# Patient Record
Sex: Male | Born: 1940 | Race: White | Hispanic: No | Marital: Married | Smoking: Former smoker
Health system: Southern US, Community
[De-identification: ages and names within clinical notes are randomized; demographics above are authoritative.]

## PROBLEM LIST (undated history)

## (undated) DIAGNOSIS — C20 Malignant neoplasm of rectum: Secondary | ICD-10-CM

## (undated) DIAGNOSIS — C61 Malignant neoplasm of prostate: Secondary | ICD-10-CM

## (undated) DIAGNOSIS — C19 Malignant neoplasm of rectosigmoid junction: Secondary | ICD-10-CM

## (undated) DIAGNOSIS — K219 Gastro-esophageal reflux disease without esophagitis: Secondary | ICD-10-CM

## (undated) DIAGNOSIS — C801 Malignant (primary) neoplasm, unspecified: Secondary | ICD-10-CM

## (undated) HISTORY — DX: Malignant (primary) neoplasm, unspecified: C80.1

## (undated) HISTORY — DX: Malignant neoplasm of rectosigmoid junction: C19

## (undated) HISTORY — DX: Malignant neoplasm of prostate: C61

## (undated) HISTORY — DX: Malignant neoplasm of rectum: C20

## (undated) HISTORY — DX: Gastro-esophageal reflux disease without esophagitis: K21.9

---

## 2000-07-09 HISTORY — PX: PROSTATECTOMY: SHX69

## 2000-09-03 ENCOUNTER — Emergency Department (HOSPITAL_COMMUNITY): Admission: EM | Admit: 2000-09-03 | Discharge: 2000-09-03 | Payer: Self-pay | Admitting: Emergency Medicine

## 2000-09-25 ENCOUNTER — Emergency Department (HOSPITAL_COMMUNITY): Admission: EM | Admit: 2000-09-25 | Discharge: 2000-09-25 | Payer: Self-pay | Admitting: Emergency Medicine

## 2003-07-10 HISTORY — PX: RECTAL SURGERY: SHX760

## 2003-11-02 ENCOUNTER — Inpatient Hospital Stay (HOSPITAL_COMMUNITY): Admission: EM | Admit: 2003-11-02 | Discharge: 2003-11-05 | Payer: Self-pay | Admitting: Emergency Medicine

## 2003-11-03 ENCOUNTER — Encounter (INDEPENDENT_AMBULATORY_CARE_PROVIDER_SITE_OTHER): Payer: Self-pay | Admitting: *Deleted

## 2003-11-05 ENCOUNTER — Encounter: Payer: Self-pay | Admitting: Oncology

## 2003-11-09 ENCOUNTER — Ambulatory Visit: Admission: RE | Admit: 2003-11-09 | Discharge: 2003-12-24 | Payer: Self-pay | Admitting: *Deleted

## 2003-12-28 ENCOUNTER — Ambulatory Visit (HOSPITAL_COMMUNITY): Admission: RE | Admit: 2003-12-28 | Discharge: 2003-12-28 | Payer: Self-pay | Admitting: Otolaryngology

## 2004-01-07 HISTORY — PX: RADICAL NECK DISSECTION: SHX2284

## 2004-01-17 ENCOUNTER — Ambulatory Visit (HOSPITAL_COMMUNITY): Admission: RE | Admit: 2004-01-17 | Discharge: 2004-01-17 | Payer: Self-pay | Admitting: Otolaryngology

## 2004-01-17 ENCOUNTER — Encounter (INDEPENDENT_AMBULATORY_CARE_PROVIDER_SITE_OTHER): Payer: Self-pay | Admitting: *Deleted

## 2004-01-21 ENCOUNTER — Ambulatory Visit: Admission: RE | Admit: 2004-01-21 | Discharge: 2004-01-21 | Payer: Self-pay | Admitting: *Deleted

## 2004-01-25 ENCOUNTER — Ambulatory Visit (HOSPITAL_COMMUNITY): Admission: RE | Admit: 2004-01-25 | Discharge: 2004-01-25 | Payer: Self-pay | Admitting: Oncology

## 2004-01-31 ENCOUNTER — Ambulatory Visit (HOSPITAL_COMMUNITY): Admission: RE | Admit: 2004-01-31 | Discharge: 2004-01-31 | Payer: Self-pay | Admitting: Otolaryngology

## 2004-01-31 ENCOUNTER — Encounter (INDEPENDENT_AMBULATORY_CARE_PROVIDER_SITE_OTHER): Payer: Self-pay | Admitting: *Deleted

## 2004-02-21 ENCOUNTER — Inpatient Hospital Stay (HOSPITAL_COMMUNITY): Admission: RE | Admit: 2004-02-21 | Discharge: 2004-02-26 | Payer: Self-pay | Admitting: Surgery

## 2004-02-21 ENCOUNTER — Encounter (INDEPENDENT_AMBULATORY_CARE_PROVIDER_SITE_OTHER): Payer: Self-pay | Admitting: Specialist

## 2004-03-07 ENCOUNTER — Ambulatory Visit: Admission: RE | Admit: 2004-03-07 | Discharge: 2004-03-22 | Payer: Self-pay | Admitting: *Deleted

## 2004-03-31 ENCOUNTER — Ambulatory Visit: Admission: RE | Admit: 2004-03-31 | Discharge: 2004-03-31 | Payer: Self-pay | Admitting: *Deleted

## 2004-04-21 ENCOUNTER — Inpatient Hospital Stay (HOSPITAL_COMMUNITY): Admission: RE | Admit: 2004-04-21 | Discharge: 2004-04-23 | Payer: Self-pay | Admitting: Otolaryngology

## 2004-04-21 ENCOUNTER — Encounter (INDEPENDENT_AMBULATORY_CARE_PROVIDER_SITE_OTHER): Payer: Self-pay | Admitting: *Deleted

## 2004-05-08 ENCOUNTER — Ambulatory Visit: Admission: RE | Admit: 2004-05-08 | Discharge: 2004-08-06 | Payer: Self-pay | Admitting: *Deleted

## 2004-05-17 ENCOUNTER — Ambulatory Visit (HOSPITAL_COMMUNITY): Admission: RE | Admit: 2004-05-17 | Discharge: 2004-05-17 | Payer: Self-pay | Admitting: *Deleted

## 2004-05-24 ENCOUNTER — Ambulatory Visit: Payer: Self-pay | Admitting: Oncology

## 2004-07-02 ENCOUNTER — Emergency Department (HOSPITAL_COMMUNITY): Admission: EM | Admit: 2004-07-02 | Discharge: 2004-07-02 | Payer: Self-pay

## 2004-07-05 ENCOUNTER — Encounter (HOSPITAL_COMMUNITY): Admission: RE | Admit: 2004-07-05 | Discharge: 2004-07-08 | Payer: Self-pay | Admitting: Oncology

## 2004-07-11 ENCOUNTER — Inpatient Hospital Stay (HOSPITAL_COMMUNITY): Admission: EM | Admit: 2004-07-11 | Discharge: 2004-07-13 | Payer: Self-pay | Admitting: Oncology

## 2004-07-11 ENCOUNTER — Ambulatory Visit: Payer: Self-pay | Admitting: Oncology

## 2004-07-15 ENCOUNTER — Ambulatory Visit (HOSPITAL_COMMUNITY): Admission: AD | Admit: 2004-07-15 | Discharge: 2004-07-15 | Payer: Self-pay | Admitting: Oncology

## 2004-07-16 ENCOUNTER — Ambulatory Visit (HOSPITAL_COMMUNITY): Admission: AD | Admit: 2004-07-16 | Discharge: 2004-07-16 | Payer: Self-pay | Admitting: Oncology

## 2004-08-30 ENCOUNTER — Ambulatory Visit: Admission: RE | Admit: 2004-08-30 | Discharge: 2004-08-30 | Payer: Self-pay | Admitting: *Deleted

## 2004-09-15 ENCOUNTER — Ambulatory Visit: Payer: Self-pay | Admitting: Oncology

## 2004-11-06 ENCOUNTER — Ambulatory Visit: Payer: Self-pay | Admitting: Oncology

## 2004-11-09 ENCOUNTER — Ambulatory Visit (HOSPITAL_COMMUNITY): Admission: RE | Admit: 2004-11-09 | Discharge: 2004-11-09 | Payer: Self-pay | Admitting: Oncology

## 2004-11-14 ENCOUNTER — Encounter: Admission: RE | Admit: 2004-11-14 | Discharge: 2005-02-12 | Payer: Self-pay | Admitting: Oncology

## 2004-11-27 ENCOUNTER — Ambulatory Visit (HOSPITAL_BASED_OUTPATIENT_CLINIC_OR_DEPARTMENT_OTHER): Admission: RE | Admit: 2004-11-27 | Discharge: 2004-11-27 | Payer: Self-pay | Admitting: Surgery

## 2004-11-27 ENCOUNTER — Ambulatory Visit (HOSPITAL_COMMUNITY): Admission: RE | Admit: 2004-11-27 | Discharge: 2004-11-27 | Payer: Self-pay | Admitting: Surgery

## 2004-11-29 ENCOUNTER — Ambulatory Visit: Admission: RE | Admit: 2004-11-29 | Discharge: 2004-11-29 | Payer: Self-pay | Admitting: *Deleted

## 2005-02-02 ENCOUNTER — Ambulatory Visit: Payer: Self-pay | Admitting: Oncology

## 2005-05-03 ENCOUNTER — Ambulatory Visit (HOSPITAL_COMMUNITY): Admission: RE | Admit: 2005-05-03 | Discharge: 2005-05-03 | Payer: Self-pay | Admitting: Oncology

## 2005-05-03 ENCOUNTER — Ambulatory Visit: Payer: Self-pay | Admitting: Oncology

## 2005-07-23 ENCOUNTER — Ambulatory Visit: Payer: Self-pay | Admitting: Oncology

## 2005-07-26 ENCOUNTER — Ambulatory Visit (HOSPITAL_COMMUNITY): Admission: RE | Admit: 2005-07-26 | Discharge: 2005-07-26 | Payer: Self-pay | Admitting: Oncology

## 2005-08-03 ENCOUNTER — Ambulatory Visit: Admission: RE | Admit: 2005-08-03 | Discharge: 2005-08-10 | Payer: Self-pay | Admitting: *Deleted

## 2006-01-17 ENCOUNTER — Ambulatory Visit: Payer: Self-pay | Admitting: Oncology

## 2006-01-18 LAB — CBC WITH DIFFERENTIAL (CANCER CENTER ONLY)
EOS%: 2 % (ref 0.0–7.0)
Eosinophils Absolute: 0.1 10*3/uL (ref 0.0–0.5)
LYMPH%: 20.5 % (ref 14.0–48.0)
MCH: 28.9 pg (ref 28.0–33.4)
MCHC: 32.9 g/dL (ref 32.0–35.9)
MCV: 88 fL (ref 82–98)
MONO%: 6.2 % (ref 0.0–13.0)
NEUT#: 2.9 10*3/uL (ref 1.5–6.5)
Platelets: 160 10*3/uL (ref 145–400)
RBC: 4.82 10*6/uL (ref 4.20–5.70)
RDW: 11.8 % (ref 10.5–14.6)

## 2006-01-19 LAB — COMPREHENSIVE METABOLIC PANEL
ALT: 15 U/L (ref 0–40)
Alkaline Phosphatase: 55 U/L (ref 39–117)
Sodium: 140 mEq/L (ref 135–145)
Total Bilirubin: 0.5 mg/dL (ref 0.3–1.2)
Total Protein: 7.7 g/dL (ref 6.0–8.3)

## 2006-01-19 LAB — CEA: CEA: 1.9 ng/mL (ref 0.0–5.0)

## 2006-01-25 ENCOUNTER — Ambulatory Visit (HOSPITAL_COMMUNITY): Admission: RE | Admit: 2006-01-25 | Discharge: 2006-01-25 | Payer: Self-pay | Admitting: Oncology

## 2006-02-08 ENCOUNTER — Ambulatory Visit: Admission: RE | Admit: 2006-02-08 | Discharge: 2006-05-09 | Payer: Self-pay | Admitting: *Deleted

## 2006-07-16 ENCOUNTER — Ambulatory Visit: Payer: Self-pay | Admitting: Oncology

## 2006-07-19 LAB — CBC WITH DIFFERENTIAL (CANCER CENTER ONLY)
BASO%: 0.3 % (ref 0.0–2.0)
Eosinophils Absolute: 0.1 10*3/uL (ref 0.0–0.5)
LYMPH%: 21 % (ref 14.0–48.0)
MCV: 89 fL (ref 82–98)
MONO#: 0.1 10*3/uL (ref 0.1–0.9)
MONO%: 3.5 % (ref 0.0–13.0)
NEUT#: 3 10*3/uL (ref 1.5–6.5)
Platelets: 144 10*3/uL — ABNORMAL LOW (ref 145–400)
RBC: 4.86 10*6/uL (ref 4.20–5.70)
RDW: 11.3 % (ref 10.5–14.6)
WBC: 4.1 10*3/uL (ref 4.0–10.0)

## 2006-07-19 LAB — COMPREHENSIVE METABOLIC PANEL
AST: 14 U/L (ref 0–37)
Albumin: 4.7 g/dL (ref 3.5–5.2)
BUN: 14 mg/dL (ref 6–23)
Calcium: 9.5 mg/dL (ref 8.4–10.5)
Chloride: 104 mEq/L (ref 96–112)
Glucose, Bld: 118 mg/dL — ABNORMAL HIGH (ref 70–99)
Potassium: 4.3 mEq/L (ref 3.5–5.3)
Total Protein: 7.6 g/dL (ref 6.0–8.3)

## 2006-07-19 LAB — CEA: CEA: 1.4 ng/mL (ref 0.0–5.0)

## 2006-07-26 ENCOUNTER — Ambulatory Visit (HOSPITAL_COMMUNITY): Admission: RE | Admit: 2006-07-26 | Discharge: 2006-07-26 | Payer: Self-pay | Admitting: Oncology

## 2007-01-16 ENCOUNTER — Ambulatory Visit: Payer: Self-pay | Admitting: Oncology

## 2007-01-17 LAB — COMPREHENSIVE METABOLIC PANEL
ALT: 14 U/L (ref 0–53)
BUN: 15 mg/dL (ref 6–23)
CO2: 25 mEq/L (ref 19–32)
Creatinine, Ser: 1.08 mg/dL (ref 0.40–1.50)
Total Bilirubin: 0.4 mg/dL (ref 0.3–1.2)

## 2007-01-17 LAB — CBC WITH DIFFERENTIAL (CANCER CENTER ONLY)
BASO%: 0.5 % (ref 0.0–2.0)
Eosinophils Absolute: 0.1 10*3/uL (ref 0.0–0.5)
HCT: 42.2 % (ref 38.7–49.9)
LYMPH%: 21.4 % (ref 14.0–48.0)
MCV: 88 fL (ref 82–98)
MONO#: 0.4 10*3/uL (ref 0.1–0.9)
NEUT%: 69.6 % (ref 40.0–80.0)
RDW: 11.1 % (ref 10.5–14.6)
WBC: 5.4 10*3/uL (ref 4.0–10.0)

## 2007-01-17 LAB — CEA: CEA: 1.4 ng/mL (ref 0.0–5.0)

## 2007-01-17 IMAGING — CT CT PELVIS W/ CM
1 of 5 series · 10 of 32 positions shown, 16 images · IV contrast (omnipaque)
Comparison: 01/25/04 CT from [HOSPITAL].
COMPARISON: 01/25/04 CT [HOSPITAL].
COMPARISON: 01/25/04 CT [HOSPITAL].

CLINICAL DATA: History of head and neck squamous cell carcinoma.  Had enlarged lymph nodes removed via modified radical neck dissection.  Rectal cancer.
CT CHEST SCAN WITH IV CONTRAST:
TECHNIQUE: Transaxial cuts were acquired during IV infusion of 125 cc Omnipaque 300.
TECHNIQUE: Transaxial cuts were acquired following oral Gastrografin and IV infusion of 125 cc of Omnipaque 300.
TECHNIQUE: Trans axial cuts were acquired from the superior mediastinum to the level of the third ventricle intracranially.  No enlarged superior mediastinal or supraclavicular nodes.  MRI neck on 12/28/03 revealed adenopathy left submandibular level.  Patient has had left neck dissection and chemotherapy and radiation therapy.  Postoperative soft tissue changes are appreciated in the left submandibular region with some slight enlargement and ill definition of margins of the left sternocleidomastoid muscle and mild edema preauricular and periparotid regions plus some thickening and ill definition of a platysma muscle.  There is a slightly prominent sized node just left anterolateral to the hyoid bone which is not pathologically enlarged.  No pathologically enlarged masses are appreciated.  
Posttherapy changes of the neck.  No findings to strongly suggest recurrent tumor.

[Series 2: cap 5.0 b40f st · axial · 0.71mm/px · z∈[-758,-228]mm · 10 of 128 slices shown, 16 images]
[im 11/128  soft-tissue]
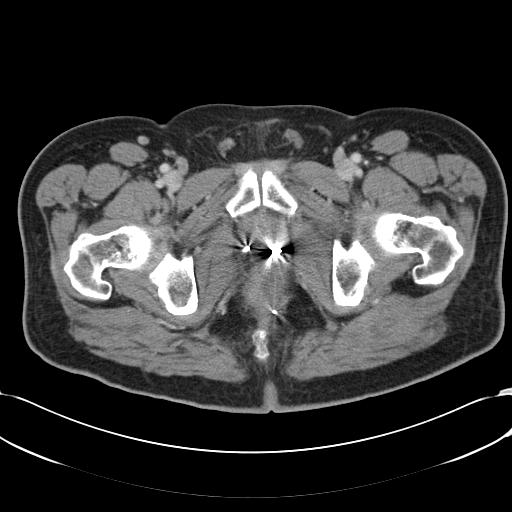
[im 11/128  bone]
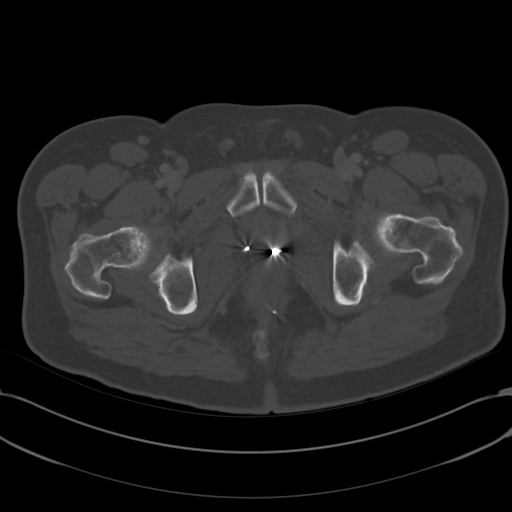
[im 22/128  soft-tissue]
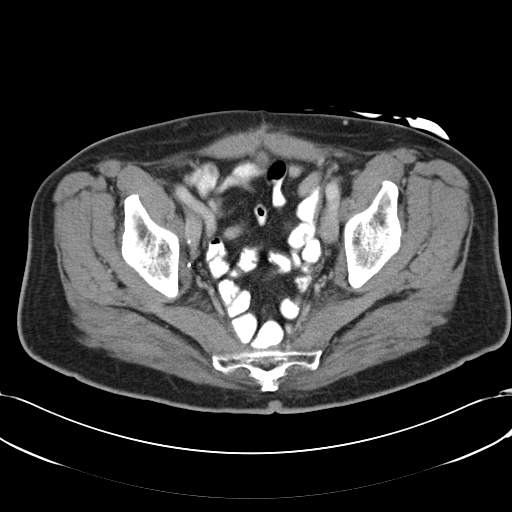
[im 32/128  soft-tissue]
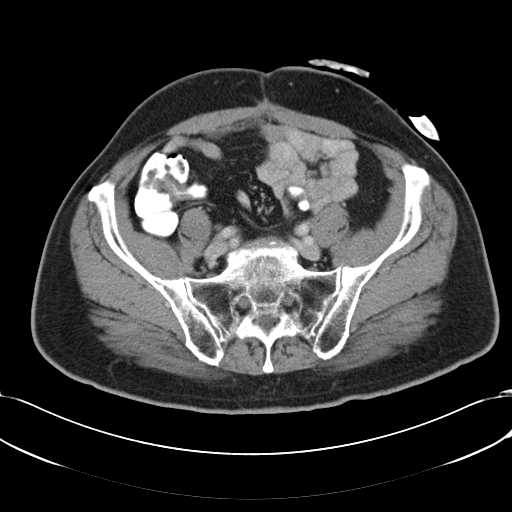
[im 43/128  soft-tissue]
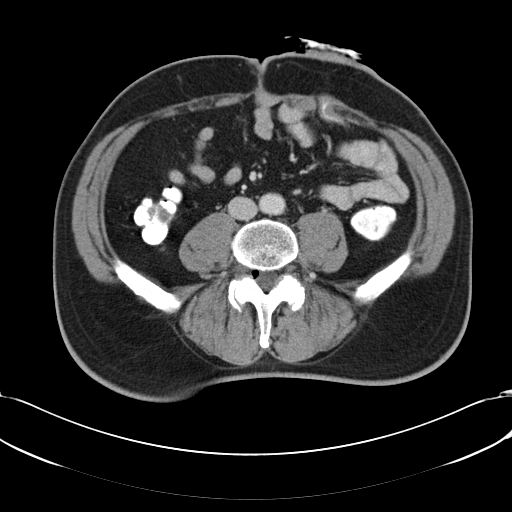
[im 53/128  soft-tissue]
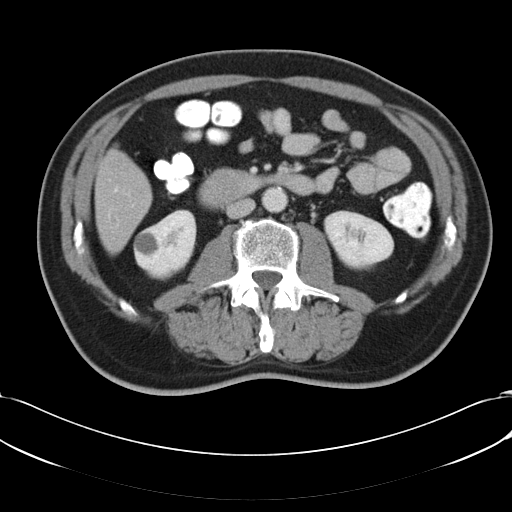
[im 75/128  soft-tissue]
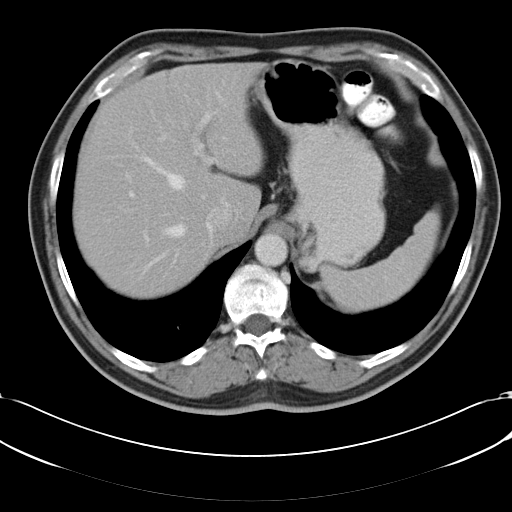
[im 85/128  soft-tissue]
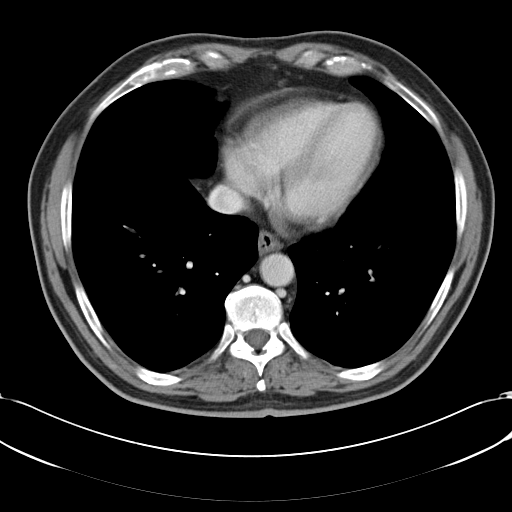
[im 85/128  lung]
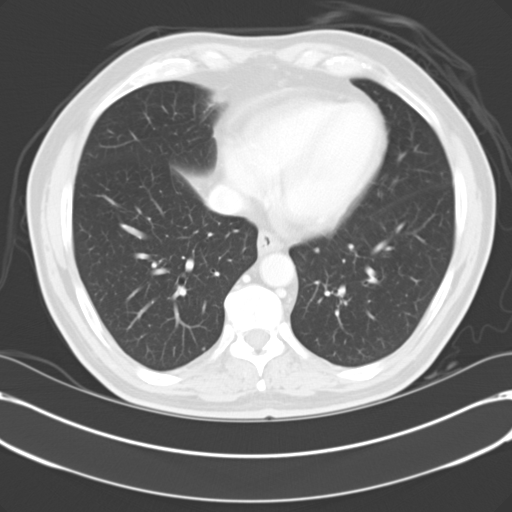
[im 96/128  soft-tissue]
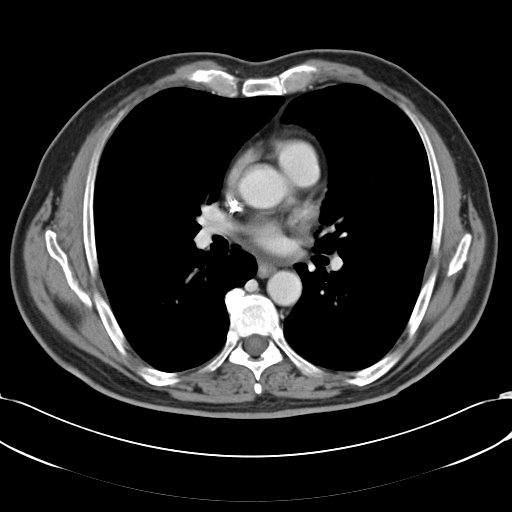
[im 96/128  lung]
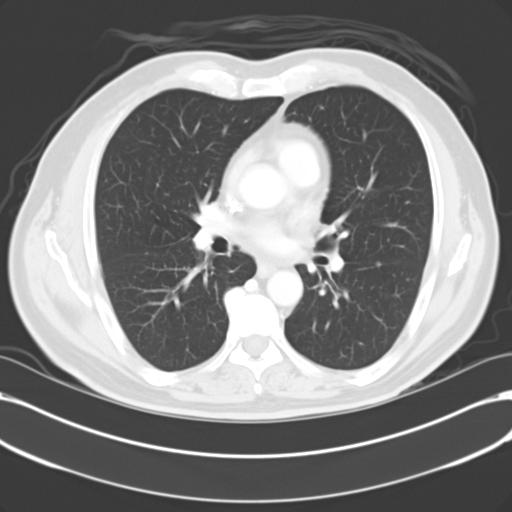
[im 106/128  soft-tissue]
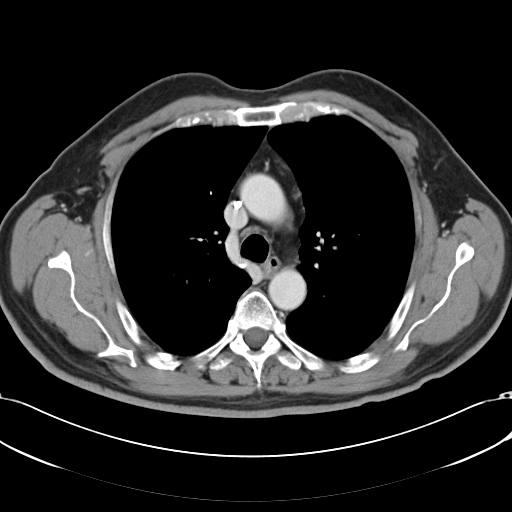
[im 106/128  lung]
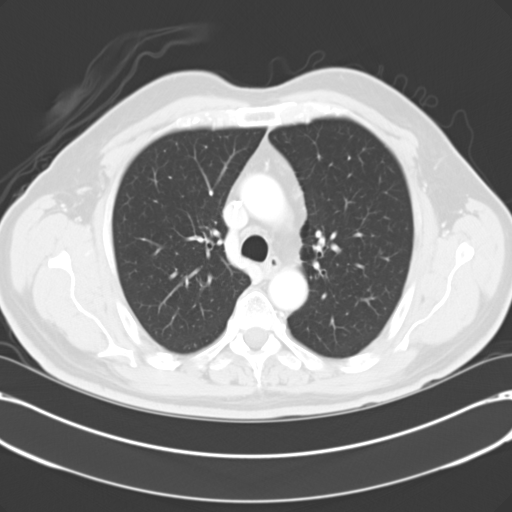
[im 106/128  bone]
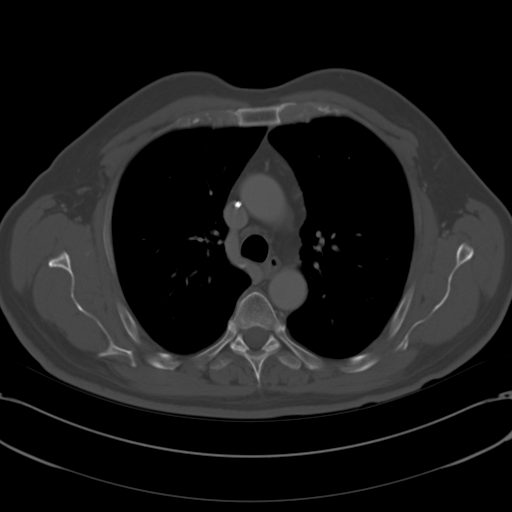
[im 117/128  soft-tissue]
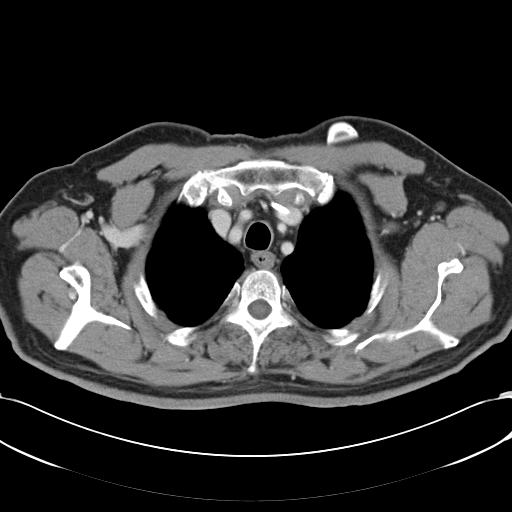
[im 117/128  lung]
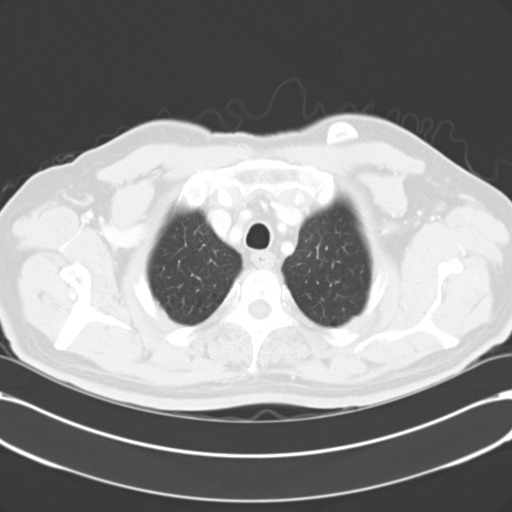

[10 of 32 positions shown; findings below may reference images not displayed]

FINDINGS: No enlarged lymph nodes.  Minute nodule right posterior lung base measuring approximately 5 mm (see image number 59) in diameter and also minute subpleural nodule posterolateral aspect of right lower lobe (see image number 39) measuring approximately 4 mm in diameter.  These two small right lung nodules were noted on the prior exam as well and are stable.  CVC via left subclavian vein approach with the tip in the SVC.
IMPRESSION: Stable chest CT scan.  No definite findings to suggest metastatic involvement.  
ABDOMINAL CT SCAN WITH IV CONTRAST:
FINDINGS: Hiatal hernia.  Negative for neoplastic involvement of the liver, spleen, or adrenal glands.  Small bilateral renal cysts the largest noted in the lateral aspect of the lower pole of the right kidney measuring 1.6 cm in diameter.  No pathologically enlarged abdominal nodes.  Ostomy site left anterior abdominal wall appears unremarkable.  Again appreciated and slightly larger is a small collection of gas in the lateral aspect of the central canal on the right at L4-5 level apparently representing nitrogen gas which probably originated from a degenerated disc or potentially a gas filled synovial cyst from degenerated facet joint.  Findings likely not clinically significant in this clinical setting.  Multiple small gallstones again noted.
IMPRESSION: Negative for neoplastic involvement of the abdomen.  See comments above.  
PELVIC CT SCAN WITH IV CONTRAST:
FINDINGS: Numerous surgical clips bilaterally low true pelvis projecting basically between the bladder and obturator internus muscles and also near the prostate and presacral region.  Presacral soft tissue thickening increased in degree since prior exam.  Additional metallic surgical clips in the presacral region since the prior exam as well.  Also now evidence of increased area of wall thickening of the bladder.  Previously noted 7 X 13 mm oval soft tissue structure just to the right of the rectum apparently representing a perirectal node is no longer appreciated.  The bladder and small bowel loops fall posteriorly and inferiorly towards the lower presacral region.  Findings are compatible with an AP resection and likely radiation therapy changes.  Subtle small bowel wall thickening may involve a couple of loops in the lower true pelvis possibly related to therapy however this latter finding is somewhat equivocal.   Incidentally appreciated is a chronic lesion involving the medial intertrochanteric region of the right femur possibly a chondroid lesion which has been noted on prior exams for example 10/26/03.
IMPRESSION: Findings compatible with posttherapy changes of the pelvis as described above.  No definite findings to strongly suggest recurrent or residual tumor.  
CT SCAN OF THE NECK WITH IV CONTRAST:
IMPRESSION: No definite evidence of recurrent neck tumor.  

Dr. Benoit review CT neck case regarding findings and comparison with PET scan and prior data.

## 2007-01-24 ENCOUNTER — Ambulatory Visit (HOSPITAL_COMMUNITY): Admission: RE | Admit: 2007-01-24 | Discharge: 2007-01-24 | Payer: Self-pay | Admitting: Oncology

## 2007-07-23 ENCOUNTER — Ambulatory Visit: Payer: Self-pay | Admitting: Oncology

## 2007-07-25 LAB — CBC WITH DIFFERENTIAL (CANCER CENTER ONLY)
BASO%: 0.4 % (ref 0.0–2.0)
EOS%: 2 % (ref 0.0–7.0)
HCT: 41.6 % (ref 38.7–49.9)
LYMPH%: 22.1 % (ref 14.0–48.0)
MCHC: 33.5 g/dL (ref 32.0–35.9)
MCV: 87 fL (ref 82–98)
MONO%: 4.2 % (ref 0.0–13.0)
NEUT%: 71.3 % (ref 40.0–80.0)
RDW: 12.1 % (ref 10.5–14.6)

## 2007-07-25 LAB — COMPREHENSIVE METABOLIC PANEL
AST: 21 U/L (ref 0–37)
Alkaline Phosphatase: 54 U/L (ref 39–117)
BUN: 16 mg/dL (ref 6–23)
Creatinine, Ser: 1.11 mg/dL (ref 0.40–1.50)
Potassium: 3.9 mEq/L (ref 3.5–5.3)
Total Bilirubin: 0.5 mg/dL (ref 0.3–1.2)

## 2007-07-25 LAB — PSA: PSA: 0.01 ng/mL — ABNORMAL LOW (ref 0.10–4.00)

## 2007-07-28 ENCOUNTER — Ambulatory Visit (HOSPITAL_COMMUNITY): Admission: RE | Admit: 2007-07-28 | Discharge: 2007-07-28 | Payer: Self-pay | Admitting: Oncology

## 2008-01-15 ENCOUNTER — Ambulatory Visit: Payer: Self-pay | Admitting: Oncology

## 2008-01-19 LAB — CBC WITH DIFFERENTIAL (CANCER CENTER ONLY)
BASO#: 0 10*3/uL (ref 0.0–0.2)
EOS%: 1.6 % (ref 0.0–7.0)
Eosinophils Absolute: 0.1 10*3/uL (ref 0.0–0.5)
HGB: 13.6 g/dL (ref 13.0–17.1)
LYMPH%: 24.4 % (ref 14.0–48.0)
MCH: 29.2 pg (ref 28.0–33.4)
MCHC: 34.3 g/dL (ref 32.0–35.9)
MCV: 85 fL (ref 82–98)
MONO%: 7.7 % (ref 0.0–13.0)
NEUT#: 2.4 10*3/uL (ref 1.5–6.5)
NEUT%: 65.8 % (ref 40.0–80.0)
RBC: 4.65 10*6/uL (ref 4.20–5.70)

## 2008-01-19 LAB — LACTATE DEHYDROGENASE: LDH: 156 U/L (ref 94–250)

## 2008-01-19 LAB — COMPREHENSIVE METABOLIC PANEL
BUN: 16 mg/dL (ref 6–23)
CO2: 23 mEq/L (ref 19–32)
Calcium: 9.2 mg/dL (ref 8.4–10.5)
Chloride: 105 mEq/L (ref 96–112)
Creatinine, Ser: 1.07 mg/dL (ref 0.40–1.50)

## 2008-01-19 LAB — PSA: PSA: 0.01 ng/mL — ABNORMAL LOW (ref 0.10–4.00)

## 2008-01-23 ENCOUNTER — Ambulatory Visit (HOSPITAL_COMMUNITY): Admission: RE | Admit: 2008-01-23 | Discharge: 2008-01-23 | Payer: Self-pay | Admitting: Oncology

## 2008-07-15 ENCOUNTER — Ambulatory Visit: Payer: Self-pay | Admitting: Oncology

## 2008-07-19 LAB — CMP (CANCER CENTER ONLY)
AST: 25 U/L (ref 11–38)
Albumin: 4 g/dL (ref 3.3–5.5)
Alkaline Phosphatase: 47 U/L (ref 26–84)
Glucose, Bld: 105 mg/dL (ref 73–118)
Potassium: 4.6 mEq/L (ref 3.3–4.7)
Sodium: 138 mEq/L (ref 128–145)
Total Protein: 7.8 g/dL (ref 6.4–8.1)

## 2008-07-19 LAB — CBC WITH DIFFERENTIAL (CANCER CENTER ONLY)
BASO#: 0 10*3/uL (ref 0.0–0.2)
EOS%: 1.6 % (ref 0.0–7.0)
Eosinophils Absolute: 0.1 10*3/uL (ref 0.0–0.5)
HCT: 41.6 % (ref 38.7–49.9)
HGB: 13.9 g/dL (ref 13.0–17.1)
LYMPH#: 1 10*3/uL (ref 0.9–3.3)
MCH: 29.4 pg (ref 28.0–33.4)
MCHC: 33.5 g/dL (ref 32.0–35.9)
NEUT%: 70.8 % (ref 40.0–80.0)
RBC: 4.74 10*6/uL (ref 4.20–5.70)

## 2008-07-19 LAB — PSA: PSA: <0.01 ng/mL — ABNORMAL LOW (ref 0.10–4.00)

## 2009-01-12 ENCOUNTER — Ambulatory Visit: Payer: Self-pay | Admitting: Oncology

## 2009-01-14 LAB — CBC WITH DIFFERENTIAL (CANCER CENTER ONLY)
BASO#: 0 10*3/uL (ref 0.0–0.2)
BASO%: 0.4 % (ref 0.0–2.0)
HCT: 38.5 % — ABNORMAL LOW (ref 38.7–49.9)
HGB: 13.4 g/dL (ref 13.0–17.1)
LYMPH#: 1 10*3/uL (ref 0.9–3.3)
MONO#: 0.2 10*3/uL (ref 0.1–0.9)
NEUT#: 2.6 10*3/uL (ref 1.5–6.5)
NEUT%: 66.9 % (ref 40.0–80.0)
WBC: 3.9 10*3/uL — ABNORMAL LOW (ref 4.0–10.0)

## 2009-01-14 LAB — CMP (CANCER CENTER ONLY)
AST: 21 U/L (ref 11–38)
BUN, Bld: 15 mg/dL (ref 7–22)
Calcium: 9.5 mg/dL (ref 8.0–10.3)
Chloride: 99 mEq/L (ref 98–108)
Creat: 1 mg/dl (ref 0.6–1.2)

## 2009-07-27 ENCOUNTER — Ambulatory Visit: Payer: Self-pay | Admitting: Oncology

## 2009-07-29 LAB — CMP (CANCER CENTER ONLY)
ALT(SGPT): 23 U/L (ref 10–47)
AST: 26 U/L (ref 11–38)
Albumin: 3.9 g/dL (ref 3.3–5.5)
BUN, Bld: 15 mg/dL (ref 7–22)
CO2: 27 mEq/L (ref 18–33)
Potassium: 4 mEq/L (ref 3.3–4.7)
Sodium: 137 mEq/L (ref 128–145)
Total Bilirubin: 0.5 mg/dl (ref 0.20–1.60)

## 2009-07-29 LAB — CBC WITH DIFFERENTIAL (CANCER CENTER ONLY)
HGB: 13.9 g/dL (ref 13.0–17.1)
LYMPH%: 26.6 % (ref 14.0–48.0)
MCH: 30.1 pg (ref 28.0–33.4)
MCHC: 33.4 g/dL (ref 32.0–35.9)
MONO%: 3.9 % (ref 0.0–13.0)
NEUT#: 3.1 10*3/uL (ref 1.5–6.5)
NEUT%: 67.2 % (ref 40.0–80.0)
RDW: 12 % (ref 10.5–14.6)

## 2009-07-29 LAB — CEA: CEA: 0.9 ng/mL (ref 0.0–5.0)

## 2010-01-31 ENCOUNTER — Ambulatory Visit: Payer: Self-pay | Admitting: Oncology

## 2010-02-02 LAB — CBC WITH DIFFERENTIAL/PLATELET
BASO%: 0.5 % (ref 0.0–2.0)
Basophils Absolute: 0 10*3/uL (ref 0.0–0.1)
EOS%: 1.3 % (ref 0.0–7.0)
HGB: 13.6 g/dL (ref 13.0–17.1)
LYMPH%: 25.9 % (ref 14.0–49.0)
MCH: 31 pg (ref 27.2–33.4)
MCHC: 34.3 g/dL (ref 32.0–36.0)
MCV: 90.3 fL (ref 79.3–98.0)
MONO#: 0.2 10*3/uL (ref 0.1–0.9)
MONO%: 5.6 % (ref 0.0–14.0)
NEUT#: 2.9 10*3/uL (ref 1.5–6.5)
NEUT%: 66.7 % (ref 39.0–75.0)
Platelets: 151 10*3/uL (ref 140–400)
lymph#: 1.1 10*3/uL (ref 0.9–3.3)

## 2010-02-02 LAB — CMP (CANCER CENTER ONLY)
ALT(SGPT): 22 U/L (ref 10–47)
Creat: 1.1 mg/dl (ref 0.6–1.2)
Potassium: 4.3 mEq/L (ref 3.3–4.7)
Sodium: 135 mEq/L (ref 128–145)
Total Protein: 7.5 g/dL (ref 6.4–8.1)

## 2010-07-30 ENCOUNTER — Encounter: Payer: Self-pay | Admitting: Oncology

## 2010-07-31 ENCOUNTER — Ambulatory Visit: Payer: Self-pay | Admitting: Oncology

## 2010-08-02 LAB — CBC WITH DIFFERENTIAL/PLATELET
HCT: 39.9 % (ref 38.4–49.9)
HGB: 13.5 g/dL (ref 13.0–17.1)
MCH: 30.3 pg (ref 27.2–33.4)
MCHC: 33.8 g/dL (ref 32.0–36.0)
MCV: 89.7 fL (ref 79.3–98.0)
MONO#: 0.2 10*3/uL (ref 0.1–0.9)
NEUT%: 68 % (ref 39.0–75.0)
RBC: 4.45 10*6/uL (ref 4.20–5.82)
lymph#: 1.1 10*3/uL (ref 0.9–3.3)

## 2010-08-02 LAB — CEA: CEA: 0.6 ng/mL (ref 0.0–5.0)

## 2010-08-02 LAB — COMPREHENSIVE METABOLIC PANEL
Albumin: 4.5 g/dL (ref 3.5–5.2)
BUN: 12 mg/dL (ref 6–23)
Calcium: 9.4 mg/dL (ref 8.4–10.5)
Chloride: 103 mEq/L (ref 96–112)
Potassium: 4.3 mEq/L (ref 3.5–5.3)
Total Protein: 7 g/dL (ref 6.0–8.3)

## 2010-10-11 ENCOUNTER — Emergency Department (HOSPITAL_COMMUNITY): Payer: Medicare Other

## 2010-10-11 ENCOUNTER — Emergency Department (HOSPITAL_COMMUNITY)
Admission: EM | Admit: 2010-10-11 | Discharge: 2010-10-11 | Disposition: A | Discharge status: Home / Self Care | Payer: Medicare Other | Attending: Emergency Medicine | Admitting: Emergency Medicine

## 2010-10-11 DIAGNOSIS — Z85048 Personal history of other malignant neoplasm of rectum, rectosigmoid junction, and anus: Secondary | ICD-10-CM | POA: Insufficient documentation

## 2010-10-11 DIAGNOSIS — R1909 Other intra-abdominal and pelvic swelling, mass and lump: Secondary | ICD-10-CM | POA: Insufficient documentation

## 2010-10-11 LAB — URINALYSIS, ROUTINE W REFLEX MICROSCOPIC
Bilirubin Urine: NEGATIVE
Nitrite: NEGATIVE
Specific Gravity, Urine: 1.021 (ref 1.005–1.030)
pH: 5.5 (ref 5.0–8.0)

## 2010-11-24 NOTE — Op Note (Signed)
NAMEROYDEN, BULMAN               ACCOUNT NO.:  [id number]   MEDICAL RECORD NO.:  [id number]          PATIENT TYPE:  AMB   LOCATION:  [facility]                         FACILITY:  [facility]   PHYSICIAN:  Thornton Park. Daphine Deutscher, MD  DATE OF BIRTH:  04/26/1941   DATE OF PROCEDURE:  11/27/2004  DATE OF DISCHARGE:                                 OPERATIVE REPORT   PROCEDURE:  Removal of left subclavian Port-A-Cath.   SURGEON:  Dr. Daphine Deutscher   ANESTHESIA:  MAC local   DESCRIPTION OF PROCEDURE:  Jamonte Curfman was brought to OR 4 at [facility] and given some sedation.  The left chest was prepped with Betadine  and draped sterilely.  I infiltrated the previous scar with  Marcaine/lidocaine and made an incision down to the port.  I removed the two  Prolene sutures that anchored it in place and then was able to explant this  without difficulty.  The wound was then closed with a running 4-0 Vicryl  with Benzoin and Steri-Strips on the skin.  The patient seemed to tolerate  the procedure well and was released in the care of his wife and will be  followed up on an as needed basis.      MBM/MEDQ  D:  11/27/2004  T:  11/27/2004  Job:  [id number]

## 2010-11-24 NOTE — H&P (Signed)
Troy Brooks, Troy Brooks NO.:  [id number]   MEDICAL RECORD NO.:  [id number]          PATIENT TYPE:  INP   LOCATION:  0255                         FACILITY:  [facility]   PHYSICIAN:  Drue Second, MD       DATE OF BIRTH:  [birth year]   DATE OF ADMISSION:  07/11/2004  DATE OF DISCHARGE:                                HISTORY & PHYSICAL   REASON FOR ADMISSION:  A 70 year old gentleman with a history of rectal  carcinoma diagnosed in April, 2005, status post preop chemotherapy and  subsequently developed squamous cell carcinoma of the head and neck, now on  concurrent cisplatin and radiation therapy.  Patient being admitted for  possible UTI and failure to establish diuresis with IV hydration.   HISTORY OF PRESENT ILLNESS:  Patient is a very pleasant 70 year old  gentleman who I originally met back in April, 2005 when he was admitted to  [facility] with rectal bleeding.  Eventual workup led to a  diagnosis of a rectal adenocarcinoma.  He was found to have a large,  fungating mass in the rectum of about 2-3 cm from the anal verge.  The mass  measured 9 cm.  He received preop chemotherapy and radiation therapy with  preop chemotherapy consisting of 5FU and leucovorin.  He subsequently had  successful  resection; however, during his staging workup for his rectal CA,  he had a PET scan done, and he was found to have a left cervical node noted,  which eventually was biopsied, and it came back positive for a squamous cell  carcinoma of the head and neck.  He underwent a resection of this.  Two  lymph nodes were found to be positive, and thereafter, he was begun on  concurrent chemo/radiation therapy for squamous cell carcinoma of the head  and neck.  He has received a duration of four weeks of radiation therapy  with weekly concurrent cisplatin.  He has been tolerating this quite well  except for development of grade mucositis and dysphagia.  Patient today was  seen in the  infusion area for his weekly chemotherapy; however, in spite of  giving him adequate IV hydration, he was not able to establish diuresis.  His urine was sent for urinalysis, and he was found to have a probable UTI.  He is therefore being admitted to [facility] for a possible  urinary tract infection/urosepsis and dehydration and possible APN/renal  insufficiency.  Clinically, the patient states that he has not been feeling  well.  Over the weekend, his po intake has been very poor.  He has not had  any fevers or chills but does have some nausea, weakness, and fatigue.  He  still has some difficulty in swallowing from his mucositis from his  radiation therapy.  He is very tired and has been sleeping a lot.  He has  occasional pain for which he has been taking morphine at home.   PAST MEDICAL HISTORY:  1.  Significant for rectal carcinoma diagnosed in April, 2005.  2.  History of prostate cancer, status post radiation  therapy.   PAST SURGICAL HISTORY:  Significant for a radical prostatectomy in 2001,  status post resection of rectal tumor after preop chemotherapy and neck  dissection in November, 2005.   ALLERGIES:  PENICILLIN.   FAMILY HISTORY:  Mother died of uterine cancer.  Father died of Alzheimer's  complications.  His father did have a history of prostate cancer and COPD.  One sister died with colon carcinoma.  One sister alive with diabetes.  Two  uncles died of colon carcinoma.   SOCIAL HISTORY:  Patient is married.  He has one grown son.  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  He  smoked one pack per day for 20 years but quit 25 years ago.  He drinks  alcohol socially.   CURRENT MEDICATIONS:  1.  Hydrocodone p.r.n.  2.  Mild magic mouthwash 5 cc q.i.d. p.r.n.  3.  Morphine sulfate 4 mg.  4.  Multivitamins q.d.  5.  Zofran 8 mg p.r.n.  6.  Compazine 10 mg p.r.n.  7.  Detrol LA 4 mg q.d.   PHYSICAL EXAMINATION:  VITAL  SIGNS:  Temperature 98.5, pulse 103,  respirations 20, blood pressure 116/70.  GENERAL:  Patient is awake and alert but tired-appearing.  Nontoxic.  HEENT:  Oral mucosa is dry.  Pharynx is erythematous.  There is still  mucositis.  No thrush.  NECK:  Supple.  A well-healed surgical scar on the left neck.  LUNGS:  Clear bilaterally.  CARDIOVASCULAR:  Regular rate and rhythm but tachycardic.  ABDOMEN:  Soft.  Colostomy is in place without any bleeding.  EXTREMITIES:  No edema.  NEUROLOGIC:  Patient is alert and oriented.  Otherwise nonfocal.  SKIN:  No rashes.   LABORATORY DATA:  From July 05, 2004:  WBC 7.1, hemoglobin 12.4,  hematocrit 36.3, platelets 138,000.  LFTs were all normal.  BUN 20.  Creatinine 0.9.   ASSESSMENT:  A 70 year old gentleman with past medical history significant  for rectal carcinoma and prostate carcinoma, currently being treated for  squamous cell carcinoma of the head and neck, status post resection.  Now on  concurrent chemotherapy/radiation therapy with chemotherapy consisting of  low-dose weekly cisplatin.  Patient is being admitted for dehydration and  probable urinary tract infection.   PLAN:  1.  UTI:  Patient will be cultured, and he will begin Avelox 400 mg IV.  He      will get blood cultures.  2.  Renal:  Patient will have a repeat BUN and creatinine performed.  He      will also receive IV fluids consisting D5 normal saline at 200 cc/hr for      a liter and then reduce the rate to 150 cc/hr until diuresis is      established.  Lasix can be given p.r.n.  Patient will have a CBC done.      His hemoglobin on the 28th was acceptable, and we will follow this.  3.  Head and neck cancer:  Patient will continue his radiation therapy      during his hospitalization, and we will notify the radiation oncologist.  4.  GI:  Patient will continue Zofran as well as encourage po intake. 5.  Mucositis:  He will continue the magic mouthwash.     Kals    KK/MEDQ  D:  07/11/2004  T:  07/11/2004  Job:  [id number]

## 2010-11-24 NOTE — Consult Note (Signed)
NAME:  Troy Brooks, DEGRAFFENREID                         ACCOUNT NO.:  [id number]   MEDICAL RECORD NO.:  [id number]                   PATIENT TYPE:  INP   LOCATION:  0153                                 FACILITY:  [facility]   PHYSICIAN:  Bertram Millard. Dahlstedt, M.D.          DATE OF BIRTH:  12-09-1940   DATE OF CONSULTATION:  02/21/2004  DATE OF DISCHARGE:                                   CONSULTATION   REFERRING PHYSICIAN:  Thornton Park. Daphine Deutscher, M.D.   REASON FOR CONSULTATION:  Urethral stricture.   BRIEF HISTORY:  A 70 year old male who is to be undergoing an abdominal  perineal resection for rectal cancer today.  The patient has a history of  prostate cancer, having undergone a radical prostatectomy in 2002 in  [place], [place].  The patient has been treated for a urethral  stricture.  He is status post recent radiation for his rectal carcinoma.   Dr. Daphine Deutscher contacted me about this patient before his anesthetic.  I  recommended catheterization to aid dissection of the abdominal perineal  procedure.  Catheterization was difficult, and urologic consultation was  requested.   Exam revealed normal external genitalia.  The phallus was uncircumcised.  The scrotal skin was unremarkable.  Testicles were normal.  Rectal was not  performed.   I attempted to place a 65 [place] coude-tip catheter.  This was unsuccessful.  I then cystoscoped the patient.  He had a very dense stricture at his  proximal urethra in the anastomotic area.  I was able to guide a filiform by  the stricture and dilated him to 49 [place] with followers.  He was also  dilated to 65 [place] with [employer].  I was unable to pass a 16  [place] coude catheter and filled the balloon with 20 cc of saline.  This was  hooked to dependent drainage.  The patient will be covered with antibiotics.   IMPRESSION:  1. Prostate cancer, status post radical prostatectomy.  2. 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   PLAN:  1. Leave the catheter in until the patient is able to void spontaneously,     when he is ambulating well.  2. Reconsult for further issues down the road.                                               Bertram Millard. Dahlstedt, M.D.    SMD/MEDQ  D:  02/21/2004  T:  02/21/2004  Job:  [id number]

## 2010-11-24 NOTE — Discharge Summary (Signed)
NAMELAIRD, Troy Brooks               ACCOUNT NO.:  [id number]   MEDICAL RECORD NO.:  [id number]          PATIENT TYPE:  INP   LOCATION:  0255                         [facility]AC[facility]   PHYSICIAN:  Drue Second, MD       DATE OF BIRTH:  09-24-1940   DATE OF ADMISSION:  07/11/2004  DATE OF DISCHARGE:  07/13/2004                                 DISCHARGE SUMMARY   DISCHARGE DIAGNOSES:  1.  Urinary tract infection symptoms, controlled, requiring intravenous      antibiotics.  2.  Failure to establish diuresis with intravenous hydration, improved.  3.  History of rectal carcinoma status post chemotherapy.  4.  Squamous cell carcinoma of the head and neck, on concurrent chemo- and      radiation therapy.  5.  Previous history of prostate cancer, status post radical prostatectomy      in 2001.   MEDICATIONS ON DISCHARGE:  1.  Avelox 400 mg one p.o. daily for 10 days.  2.  Hydrocodone p.r.n.  3.  Magic Mouthwash 5 mL q.i.d. p.r.n.  4.  Morphine sulfate 4 mg or as directed.  5.  Multivitamins daily.  6.  Zofran 8 mg p.r.n.  7.  Compazine 10 mg p.r.n.  8.  Detrol LA 4 mg daily.   LABORATORY DATA ON DISCHARGE:  Hemoglobin 10.4, hematocrit 31, white count  1.5, platelets 93.  Sodium 139, potassium 4.0, BUN 4, creatinine 0.7,  glucose 117, calcium 8.6.  Blood culture negative for growth.  LFTs normal.   HISTORY OF PRESENT ILLNESS:  Troy Brooks is a pleasant 70 year old gentleman  with a history of rectal carcinoma diagnosed in April 2005 +status post  preoperative chemotherapy and subsequently developing squamous cell  carcinoma of the head and neck, now on concurrent cisplatin and radiation  therapy.  He was admitted for possible UTI and failure to establish diuresis  with IV hydration.  For further details on the patient's oncologic history,  please refer to the well-detailed History and Physical note.   On admission, the patient had blood cultures and Avelox 400 mg IV was  initiated.   Also, he had urinary cultures drawn.  He was given IV fluids  consisting of D-5/normal saline at 200 mL/hour for a liter, then reducing  the rate to 150 mL/hour until the diuresis was established.  Lasix was given  p.r.n.  Over the following 24 hours, his condition began to improve and his  diuresis slowly began to trend to normalization.  He received treatment #26  of the planned course of 33 treatments while in the hospital.  He was to  receive his dose of chemotherapy on July 13, 2004, but because of the  medical problems he was admitted with along with some not well-controlled  nausea and dysphagia believed to be secondary to radiation, it was decided  that the patient would not receive chemotherapy while hospitalized and a new  schedule for chemo would be given.  Dr. Welton Flakes evaluated the patient on  July 13, 2004 and after discussing Troy Brooks' condition, it was decided  that since his  overall status was stable he could be discharged  home on p.o. antibiotics, continue with his radiation oncology as scheduled  as an outpatient, and see Dr. Welton Flakes on July 14, 2004 for follow-up in order  to determine when this patient could receive chemotherapy.  The patient  agreed to this close monitoring and is eager to go home.     Huntley Dec   SW/MEDQ  D:  07/14/2004  T:  07/14/2004  Job:  [id number]

## 2010-12-18 ENCOUNTER — Other Ambulatory Visit: Payer: Self-pay | Admitting: Urology

## 2010-12-18 ENCOUNTER — Encounter (HOSPITAL_COMMUNITY): Payer: Medicare Other

## 2010-12-18 LAB — CBC
Hemoglobin: 13.7 g/dL (ref 13.0–17.0)
MCH: 29.4 pg (ref 26.0–34.0)
RBC: 4.66 MIL/uL (ref 4.22–5.81)
WBC: 4.6 10*3/uL (ref 4.0–10.5)

## 2010-12-18 LAB — ABO/RH: ABO/RH(D): O POS

## 2010-12-18 LAB — PROTIME-INR
INR: 0.95 (ref 0.00–1.49)
Prothrombin Time: 12.9 seconds (ref 11.6–15.2)

## 2010-12-18 LAB — APTT: aPTT: 34 seconds (ref 24–37)

## 2010-12-18 LAB — SURGICAL PCR SCREEN: MRSA, PCR: NEGATIVE

## 2010-12-19 ENCOUNTER — Other Ambulatory Visit (HOSPITAL_COMMUNITY): Payer: Medicare Other

## 2010-12-26 ENCOUNTER — Observation Stay (HOSPITAL_COMMUNITY)
Admission: RE | Admit: 2010-12-26 | Discharge: 2010-12-27 | Disposition: A | Discharge status: Home / Self Care | Payer: Medicare Other | Source: Ambulatory Visit | Attending: Urology | Admitting: Urology

## 2010-12-26 DIAGNOSIS — N393 Stress incontinence (female) (male): Principal | ICD-10-CM | POA: Insufficient documentation

## 2010-12-26 DIAGNOSIS — K219 Gastro-esophageal reflux disease without esophagitis: Secondary | ICD-10-CM | POA: Insufficient documentation

## 2010-12-26 DIAGNOSIS — Z85038 Personal history of other malignant neoplasm of large intestine: Secondary | ICD-10-CM | POA: Insufficient documentation

## 2010-12-26 DIAGNOSIS — Z01812 Encounter for preprocedural laboratory examination: Secondary | ICD-10-CM | POA: Insufficient documentation

## 2010-12-26 DIAGNOSIS — Z933 Colostomy status: Secondary | ICD-10-CM | POA: Insufficient documentation

## 2010-12-26 LAB — BASIC METABOLIC PANEL
BUN: 11 mg/dL (ref 6–23)
Calcium: 9.1 mg/dL (ref 8.4–10.5)
Creatinine, Ser: 0.95 mg/dL (ref 0.50–1.35)
GFR calc Af Amer: >60 mL/min (ref >60)
GFR calc non Af Amer: >60 mL/min (ref >60)
Sodium: 140 mEq/L (ref 135–145)

## 2010-12-26 LAB — HEMOGLOBIN AND HEMATOCRIT, BLOOD: HCT: 38.9 % — ABNORMAL LOW (ref 39.0–52.0)

## 2010-12-27 LAB — BASIC METABOLIC PANEL
BUN: 11 mg/dL (ref 6–23)
Calcium: 9.6 mg/dL (ref 8.4–10.5)
Creatinine, Ser: 0.94 mg/dL (ref 0.50–1.35)
GFR calc non Af Amer: >60 mL/min (ref >60)
Glucose, Bld: 119 mg/dL — ABNORMAL HIGH (ref 70–99)
Potassium: 4.4 mEq/L (ref 3.5–5.1)

## 2011-01-09 NOTE — Op Note (Signed)
Troy Brooks, Troy Brooks               ACCOUNT NO.:  [id number]  MEDICAL RECORD NO.:  [id number]  LOCATION:  1427                         FACILITY:  [facility]  PHYSICIAN:  Martina Sinner, MD DATE OF BIRTH:  04-18-1941  DATE OF PROCEDURE:  12/26/2010 DATE OF DISCHARGE:                              OPERATIVE REPORT   SURGEON:  Dnyla Antonetti A. Kassidy Dockendorf, MD  ASSISTANT:  Delia Chimes, NP  PREOPERATIVE DIAGNOSIS:  Stress urinary incontinence.  POSTOPERATIVE DIAGNOSIS:  Stress urinary incontinence.  PROCEDURE:  Implantation of artificial urinary sphincter.  DESCRIPTION OF PROCEDURE:  Troy Brooks has had an abdominoperineal resection and has a colostomy in his left lower quadrant.  He was consented to the above procedure.  He was prepped and draped being very careful to sterilely isolate his colostomy.  Extra care was taken with leg positioning and minimize risk of compartment syndrome, neuropathy, and DVT.  I sewed a green towel over the area where I thought his rectum likely was.  A 5-6 cm incision was made through his short perineum, staying closer to the scrotum to make certain I stayed in more virginal tissue initially. I dissected down through scar, subcutaneous tissue and easily located the bulbous spongiosis muscle, which I split in the midline.  I mobilized the bulbar urethra and it was obvious that I had to go back another 2 cm or so to be at the bifurcation of corporal body.  His urethra looked healthy, but it was fairly narrow necessitating a 4 cm cuff.  I did not choose a 3.5 cm cuff.  I did like my bony landmarks and I sharply mobilized the bulbar urethra by incising Buck's fascia along the corporal bodies bilaterally and passing a right angle instrument at the level of corporal bodies from right to left through the sail of tissue in the midline.  I sharply dissected an opening about the size of my left index finger and not injuring urethra.  Sterile gauze  was applied.  Patient was taken out of Trendelenburg and after marking landmarks, I marked a 6 cm right lower quadrant incision, dissected down through subcutaneous tissue to the external oblique aponeurosis.  I incised along the fibers.  With appropriate retractors, I split with a long Kelly the smooth muscle and finger dissected to the transversalis fascia.  I placed a prepared deflated cuff and put 25 cc of normal saline in it.  I then closed the fascia with running 0 Vicryl on CT-1 needle not piercing the tubing.  I then finger dissected to the right hemiscrotum with my hand along the level of the fascia.  I delivered the right hemiscrotum using my usual technique and retractor and used a peanut to scrape away soft tissue to get in the correct plane.  I correctly prepared pump with ring forceps down to the right hemiscrotum and was in very good dependent position. Light protective Tanja Port was applied.  I then delivered a 4 cm cuff with the usual technique at the bifurcation of corporal bodies around the bulbar urethra.  I cut off the tab after cinching it down.  I passed the tubing underneath the bulbous spongiosis muscle up through the abdominal dissection and  incision.  With correct tension on the pump, I used quick connects in the usual technique to connect blue and clear tubings with quick connects.  There was little bit of redundant tissue, but he was moderately obese and this could be laid in nicely and covered with fat.  All incisions were irrigated.  Two-layer closure of 3-0 Vicryl followed by 4-0 subcuticular was used for abdominal incision.  Three layers of 3- 0 Vicryl followed by 4-0 Vicryl subcuticular was used for the perineum. The device was cycled twice before it was deactivated with a modest dimple.  Leg position was good at the end of the case and was draining clear urine.  Troy Brooks' surgery went very well.  Hopefully, he will reach his treatment goal.   We will proceed accordingly.          ______________________________ Martina Sinner, MD     SAM/MEDQ  D:  12/26/2010  T:  12/26/2010  Job:  [id number]  Electronically Signed by Alfredo Martinez MD on 01/09/2011 12:29:04 PM

## 2011-08-01 ENCOUNTER — Encounter: Payer: Self-pay | Admitting: *Deleted

## 2011-08-02 ENCOUNTER — Encounter: Payer: Self-pay | Admitting: Oncology

## 2011-08-02 ENCOUNTER — Other Ambulatory Visit: Payer: Medicare Other | Admitting: Lab

## 2011-08-02 ENCOUNTER — Ambulatory Visit (HOSPITAL_BASED_OUTPATIENT_CLINIC_OR_DEPARTMENT_OTHER): Payer: Medicare Other | Admitting: Oncology

## 2011-08-02 DIAGNOSIS — C61 Malignant neoplasm of prostate: Secondary | ICD-10-CM | POA: Insufficient documentation

## 2011-08-02 DIAGNOSIS — Z85828 Personal history of other malignant neoplasm of skin: Secondary | ICD-10-CM

## 2011-08-02 DIAGNOSIS — Z85048 Personal history of other malignant neoplasm of rectum, rectosigmoid junction, and anus: Secondary | ICD-10-CM

## 2011-08-02 DIAGNOSIS — C20 Malignant neoplasm of rectum: Secondary | ICD-10-CM

## 2011-08-02 DIAGNOSIS — Z8546 Personal history of malignant neoplasm of prostate: Secondary | ICD-10-CM

## 2011-08-02 HISTORY — DX: Malignant neoplasm of rectum: C20

## 2011-08-02 LAB — COMPREHENSIVE METABOLIC PANEL
AST: 24 U/L (ref 0–37)
Albumin: 4.6 g/dL (ref 3.5–5.2)
Alkaline Phosphatase: 50 U/L (ref 39–117)
BUN: 11 mg/dL (ref 6–23)
Potassium: 4.7 mEq/L (ref 3.5–5.3)
Sodium: 139 mEq/L (ref 135–145)
Total Bilirubin: 0.4 mg/dL (ref 0.3–1.2)

## 2011-08-02 LAB — CBC WITH DIFFERENTIAL/PLATELET
BASO%: 0.2 % (ref 0.0–2.0)
EOS%: 0.9 % (ref 0.0–7.0)
MCH: 30.9 pg (ref 27.2–33.4)
MCHC: 33.6 g/dL (ref 32.0–36.0)
NEUT%: 67.2 % (ref 39.0–75.0)
RDW: 13.7 % (ref 11.0–14.6)
lymph#: 1.1 10*3/uL (ref 0.9–3.3)

## 2011-08-02 NOTE — Progress Notes (Signed)
OFFICE PROGRESS NOTE  CC Dr. Luretha Murphy Dr. Arnoldo Hooker Dr. 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  PRIOR THERAPY:  #1 rectal cancer: She was diagnosed with invasive adenocarcinoma of the rectum treated with concurrent neoadjuvant chemotherapy and radiation therapy. The neoadjuvant chemotherapy consisted of infusional 5-FU from May 2005 through June 2005. In August 2005 patient underwent abdominal perineal resection. With a colostomy.  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 Concurrently with radiation he received cis-platinum on a weekly basis from 05/13/2004 through December 2005.  #3 prostate cancer: Patient is status post prostate ectomy in 2002.  CURRENT THERAPY:Observation  INTERVAL HISTORY: Troy Brooks 71 y.o. male returns for Followup visit today. He continues to see me on a yearly basis. Overall he is doing well he denies any fevers chills night sweats headaches shortness of breath chest pains palpitations. He has no myalgias or arthralgias. He denies any hematuria hematochezia melena hemoptysis or hematemesis. He has not noticed any lumps or bumps in his neck. He has no difficulty in swallowing. He has no abnormal pain he has not noticed any blood in the stool. Remainder of the 10 point review of systems is negative.  MEDICAL HISTORY: Past Medical History  Diagnosis Date  . Prostate cancer   . Carcinoma of rectosigmoid junction   . Cancer   . GERD (gastroesophageal reflux disease)   . Rectal adenocarcinoma 08/02/2011     ALLERGIES:  is allergic to penicillins.  MEDICATIONS:  Current Outpatient Prescriptions  Medication Sig Dispense Refill  . Multiple Vitamin (MULTIVITAMIN) tablet Take 1 tablet by mouth daily.      . ranitidine (ZANTAC) 150 MG tablet Take 150 mg by mouth 2 (two) times daily.        SURGICAL HISTORY:  Past Surgical History  Procedure Date  . Rectal surgery 2005  . Prostatectomy 2002  . Radical neck dissection july 2005    REVIEW OF SYSTEMS:  Pertinent items are noted in HPI.   PHYSICAL EXAMINATION: General appearance: alert, cooperative and appears stated age Head: Normocephalic, without obvious abnormality, atraumatic Neck: no adenopathy, no carotid bruit, no JVD, supple, symmetrical, trachea midline and thyroid not enlarged, symmetric, no tenderness/mass/nodules Lymph nodes: Cervical, supraclavicular, and axillary nodes normal. Resp: clear to auscultation bilaterally and normal percussion bilaterally Back: symmetric, no curvature. ROM normal. No CVA tenderness. Cardio: regular rate and rhythm, S1, S2 normal, no murmur, click, rub or gallop and normal apical impulse GI: soft, non-tender; bowel sounds normal; no masses,  no organomegaly Extremities: extremities normal, atraumatic, no cyanosis or edema Neurologic: Alert and oriented X 3, normal strength and tone. Normal symmetric reflexes. Normal coordination and gait  ECOG PERFORMANCE STATUS: 0 - Asymptomatic  Blood pressure 147/98, pulse 93, temperature 98.6 F (37 C), temperature source Oral, height 5' 6.5" (1.689 m), weight 202 lb 14.4 oz (92.035 kg).  LABORATORY DATA: Lab Results  Component Value Date   WBC 4.5 08/02/2011   HGB 13.9 08/02/2011   HCT 41.4 08/02/2011   MCV 92.0 08/02/2011   PLT 157 08/02/2011  Chemistry      Component Value Date/Time   NA 137 12/27/2010 0450   NA 135 02/02/2010 1143   K 4.4 12/27/2010 0450   K 4.3 02/02/2010 1143   CL 104 12/27/2010 0450   CL 99 02/02/2010 1143   CO2 28 12/27/2010  0450   CO2 29 02/02/2010 1143   BUN 11 12/27/2010 0450   BUN 14 02/02/2010 1143   CREATININE 0.94 12/27/2010 0450   CREATININE 1.1 02/02/2010 1143      Component Value Date/Time   CALCIUM 9.6 12/27/2010 0450   CALCIUM 9.9 02/02/2010 1143   ALKPHOS 48 08/02/2010 1113   ALKPHOS 60 02/02/2010 1143   AST 16 08/02/2010 1113   AST 24 02/02/2010 1143   ALT 16 08/02/2010 1113   BILITOT 0.4 08/02/2010 1113   BILITOT 0.80 02/02/2010 1143       RADIOGRAPHIC STUDIES:  No results found.  ASSESSMENT: 71 year old gentleman with 3 primary malignancies including rectal carcinoma squamous cell carcinoma of the head and neck and prostate cancer. Patient is without any evidence of recurrent disease. He is now 6-7 years out since his diagnosis of head and neck and rectal carcinomas. Overall he is doing well.   PLAN:   We will continue to follow the patient on a yearly basis. With each to followup he will have a CBC CMET and CEA and PSA is performed.   All questions were answered. The patient knows to call the clinic with any problems, questions or concerns. We can certainly see the patient much sooner if necessary.  I spent 20 minutes counseling the patient face to face. The total time spent in the appointment was 30 minutes.    Drue Second, MD Medical/Oncology [facility] [phone] (beeper) [phone] (Office)  08/02/2011, 10:30 AM

## 2011-10-15 ENCOUNTER — Other Ambulatory Visit: Payer: Self-pay | Admitting: *Deleted

## 2011-10-15 DIAGNOSIS — C20 Malignant neoplasm of rectum: Secondary | ICD-10-CM

## 2011-10-15 MED ORDER — ADAPT PSTE: 1.0000 | PASTE | Status: DC | PRN

## 2011-10-15 MED ORDER — OSTOMY DRAIN/MINI-POUCH/FLANGE MISC: 1.0000 [IU] | Freq: Two times a day (BID) | Status: DC

## 2011-12-05 ENCOUNTER — Other Ambulatory Visit: Payer: Self-pay | Admitting: Oncology

## 2011-12-05 DIAGNOSIS — C20 Malignant neoplasm of rectum: Secondary | ICD-10-CM

## 2012-02-29 ENCOUNTER — Other Ambulatory Visit: Payer: Self-pay | Admitting: *Deleted

## 2012-02-29 DIAGNOSIS — C20 Malignant neoplasm of rectum: Secondary | ICD-10-CM

## 2012-02-29 MED ORDER — UNABLE TO FIND: 1.0000 | Status: DC | PRN

## 2012-08-01 ENCOUNTER — Telehealth: Payer: Self-pay | Admitting: Oncology

## 2012-08-01 ENCOUNTER — Encounter: Payer: Self-pay | Admitting: Physician Assistant

## 2012-08-01 ENCOUNTER — Ambulatory Visit (HOSPITAL_BASED_OUTPATIENT_CLINIC_OR_DEPARTMENT_OTHER): Payer: Medicare Other | Admitting: Physician Assistant

## 2012-08-01 ENCOUNTER — Other Ambulatory Visit: Payer: Medicare Other | Admitting: Lab

## 2012-08-01 VITALS — BP 168/84 | HR 94 | Temp 98.5°F | Resp 20 | Ht 66.5 in | Wt 207.8 lb

## 2012-08-01 DIAGNOSIS — Z85048 Personal history of other malignant neoplasm of rectum, rectosigmoid junction, and anus: Secondary | ICD-10-CM

## 2012-08-01 DIAGNOSIS — M25519 Pain in unspecified shoulder: Secondary | ICD-10-CM

## 2012-08-01 DIAGNOSIS — Z8546 Personal history of malignant neoplasm of prostate: Secondary | ICD-10-CM

## 2012-08-01 DIAGNOSIS — C61 Malignant neoplasm of prostate: Secondary | ICD-10-CM

## 2012-08-01 DIAGNOSIS — C20 Malignant neoplasm of rectum: Secondary | ICD-10-CM

## 2012-08-01 DIAGNOSIS — Z85828 Personal history of other malignant neoplasm of skin: Secondary | ICD-10-CM

## 2012-08-01 LAB — COMPREHENSIVE METABOLIC PANEL (CC13)
AST: 24 U/L (ref 5–34)
Albumin: 4 g/dL (ref 3.5–5.0)
BUN: 15.6 mg/dL (ref 7.0–26.0)
Calcium: 9.8 mg/dL (ref 8.4–10.4)
Chloride: 104 mEq/L (ref 98–107)
Glucose: 108 mg/dl — ABNORMAL HIGH (ref 70–99)
Potassium: 4.5 mEq/L (ref 3.5–5.1)

## 2012-08-01 LAB — CBC WITH DIFFERENTIAL/PLATELET
Basophils Absolute: 0 10*3/uL (ref 0.0–0.1)
EOS%: 1.1 % (ref 0.0–7.0)
Eosinophils Absolute: 0.1 10*3/uL (ref 0.0–0.5)
HGB: 13.8 g/dL (ref 13.0–17.1)
LYMPH%: 25.1 % (ref 14.0–49.0)
MCH: 30.3 pg (ref 27.2–33.4)
MCV: 91.8 fL (ref 79.3–98.0)
MONO%: 8.3 % (ref 0.0–14.0)
NEUT#: 3.2 10*3/uL (ref 1.5–6.5)
Platelets: 153 10*3/uL (ref 140–400)
RDW: 13.5 % (ref 11.0–14.6)

## 2012-08-01 NOTE — Progress Notes (Signed)
[facility]  Telephone:([phone] Fax:([phone]) [phone]   OFFICE PROGRESS NOTE    CC  Dr. Luretha Murphy  Dr. Arnoldo Hooker  Dr. 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  PRIOR THERAPY:  #1 rectal cancer: She was diagnosed with invasive adenocarcinoma of the rectum treated with concurrent neoadjuvant chemotherapy and radiation therapy. The neoadjuvant chemotherapy consisted of infusional 5-FU from May 2005 through June 2005. In August 2005 patient underwent abdominal perineal resection. With a colostomy.   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 Concurrently with radiation he received cis-platinum on a weekly basis from 05/13/2004 through December 2005.   #3 prostate cancer: Patient is status post prostatectomy in 2002.   INTERVAL HISTORY:  Troy Brooks 72 y.o. male Troy Brooks 72 y.o. male returns for Followup visit today. He continues to see me on a yearly basis. Overall he is doing well. He denies any fevers, chills, night sweats, headaches, shortness of breath, chest pains, or  palpitations. He has no myalgias. His left shoulder has been intermittently painful, especially with rough movements such as when playing golf, but pain is not constant. He states that the pain is similar to that of 10 years ago, on the right shoulder, which required steroid injections with resolution of symptoms. His PCP at [facility] performed a L shoulder X ray which results are not available for review. He denies any hematuria, hematochezia, melena,  hemoptysis,  hematemesis. He has not noticed any areas of concern in the neck. No difficulty in swallowing. He has no abnormal pain.His ostomy is functioning properly. Remainder of the 10 point review of systems is negative. Last CEA on 08/02/11 was 0.7 with PSA less than 0.01   Past Medical History  Diagnosis Date  . Prostate cancer   . Carcinoma of rectosigmoid junction   . Cancer   . GERD (gastroesophageal reflux disease)   . Rectal adenocarcinoma 08/02/2011    Past Surgical History  Procedure Date  . Rectal surgery 2005  . Prostatectomy 2002  . Radical neck dissection july 2005    Current Outpatient Prescriptions  Medication Sig Dispense Refill  . Multiple Vitamin (MULTIVITAMIN) tablet Take 1 tablet by mouth daily.      Sherren Mocha Supplies (ADAPT) PSTE 1 Tube by Does not apply route as needed. Apply as directed  120 Tube  PRN  . Ostomy Supplies (OSTOMY DRAIN/MINI-POUCH/FLANGE) MISC 1 Units by Does not apply route 2 (two) times daily.  60 each  prn  . ranitidine (ZANTAC) 150 MG tablet Take 150 mg by mouth 2 (two) times daily.      Marland Kitchen UNABLE TO FIND Apply 1 Device topically as needed. Med Hollister Skin barrier w/flange Item Number [id number]  Apply as directed  30 Device  PRN    ALLERGIES:  is allergic to penicillins.  REVIEW OF SYSTEMS:  No respiratory or cardiac complaints.  The rest of the 14-point review of system was negative.   Filed Vitals:   08/01/12 0843  BP: 168/84  Pulse: 94  Temp:  98.5 F (36.9 C)  Resp: 20   Wt Readings from Last 3 Encounters:  08/01/12 207 lb 12.8 oz (94.257 kg)  08/02/11 202 lb 14.4 oz (92.035 kg)     PHYSICAL EXAMINATION:  BP 168/84  Pulse 94  Temp 98.5 F (36.9 C) (Oral)  Resp 20  Ht 5' 6.5" (1.689 m)  Wt 207 lb 12.8 oz (94.257 kg)  BMI 33.04 kg/m2  General appearance: alert, cooperative and appears stated age  Head: Normocephalic, without obvious abnormality, atraumatic. Wears glasses.  Neck: no adenopathy, no carotid bruit, no  JVD, supple, symmetrical, trachea midline and thyroid not enlarged, symmetric, no tenderness/mass/nodules  Lymph nodes: Cervical, supraclavicular, and axillary nodes normal.  Resp: clear to auscultation bilaterally and normal percussion bilaterally  Back: symmetric, no curvature. ROM normal. No CVA tenderness.  Cardio: regular rate and rhythm, S1, S2 normal, no murmur, click, rub or gallop and normal apical impulse  GI: soft, non-tender; bowel sounds normal; no masses, no organomegaly. Left ostomy negative.   Extremities: extremities normal, atraumatic, no cyanosis or edema  Musculoskeletal: L shoulder tender at the rotator cuff area,worse with abduction, negative with adduction.  Neurologic: Alert and oriented X 3, normal strength and tone. Normal symmetric reflexes. Normal coordination and gait  ECOG PERFORMANCE STATUS: 0 - Asymptomatic      LABORATORY/RADIOLOGY DATA:  Lab Results  Component Value Date   WBC 4.9 08/01/2012   HGB 13.8 08/01/2012   HCT 41.9 08/01/2012   PLT 153 08/01/2012   GLUCOSE 108* 08/01/2012   ALKPHOS 73 08/01/2012   ALT 21 08/01/2012   AST 24 08/01/2012   NA 142 08/01/2012   K 4.5 08/01/2012   CL 104 08/01/2012   CREATININE 1.0 08/01/2012   BUN 15.6 08/01/2012   CO2 25 08/01/2012   INR 0.95 12/18/2010    RADIOGRAPHIC STUDIES:  No results found   ASSESSMENT AND PLAN:   1. Troy Brooks 72 y.o. male with   72 year old gentleman with 3 primary malignancies including rectal carcinoma squamous cell carcinoma of the head and neck and prostate cancer. Patient is without any evidence of recurrent disease. He is now 7-8 years out since his diagnosis of head and neck and rectal carcinomas. Overall he is doing well.  PAtient is to call today his PCP to obtain the Left shoulder  for X rays results, rule out malignancy.   PLAN:   We will continue to follow the patient on a yearly basis. With each to followup he will have a CBC CMET and CEA and PSA is performed.  All  questions were answered. The patient knows to call the clinic with any problems, questions or concerns. We can certainly see the patient much sooner if necessary.  Time  spent 20 minutes counseling the patient face to face. The total time spent in the appointment was 30 minutes.  Case discussed with Dr. Welton Flakes

## 2012-08-01 NOTE — Patient Instructions (Addendum)
Doing well  We will see you back in 1 year 

## 2012-08-01 NOTE — Telephone Encounter (Signed)
s.w. pt and advised on Jan 2015 appt...pt ok and aware °

## 2012-11-04 ENCOUNTER — Other Ambulatory Visit: Payer: Self-pay | Admitting: *Deleted

## 2012-11-04 DIAGNOSIS — C20 Malignant neoplasm of rectum: Secondary | ICD-10-CM

## 2012-11-04 MED ORDER — OSTOMY DRAIN/MINI-POUCH/FLANGE MISC: 1.0000 [IU] | Freq: Two times a day (BID) | Status: DC

## 2012-12-18 ENCOUNTER — Other Ambulatory Visit: Payer: Self-pay | Admitting: *Deleted

## 2012-12-18 DIAGNOSIS — C20 Malignant neoplasm of rectum: Secondary | ICD-10-CM

## 2012-12-18 MED ORDER — ADAPT PSTE: PASTE | Status: DC

## 2013-08-04 ENCOUNTER — Telehealth: Payer: Self-pay | Admitting: Oncology

## 2013-08-06 ENCOUNTER — Other Ambulatory Visit: Payer: Medicare Other

## 2013-08-06 ENCOUNTER — Ambulatory Visit: Payer: Medicare Other | Admitting: Oncology

## 2013-10-07 ENCOUNTER — Encounter: Payer: Self-pay | Admitting: Oncology

## 2013-10-07 ENCOUNTER — Telehealth: Payer: Self-pay | Admitting: Oncology

## 2013-10-07 ENCOUNTER — Ambulatory Visit (HOSPITAL_BASED_OUTPATIENT_CLINIC_OR_DEPARTMENT_OTHER): Payer: Medicare Other | Admitting: Oncology

## 2013-10-07 ENCOUNTER — Other Ambulatory Visit (HOSPITAL_BASED_OUTPATIENT_CLINIC_OR_DEPARTMENT_OTHER): Payer: Medicare Other

## 2013-10-07 VITALS — BP 156/76 | HR 76 | Temp 98.3°F | Resp 18 | Ht 66.5 in | Wt 207.0 lb

## 2013-10-07 DIAGNOSIS — Z85048 Personal history of other malignant neoplasm of rectum, rectosigmoid junction, and anus: Secondary | ICD-10-CM

## 2013-10-07 DIAGNOSIS — Z85828 Personal history of other malignant neoplasm of skin: Secondary | ICD-10-CM

## 2013-10-07 DIAGNOSIS — C20 Malignant neoplasm of rectum: Secondary | ICD-10-CM

## 2013-10-07 DIAGNOSIS — Z8546 Personal history of malignant neoplasm of prostate: Secondary | ICD-10-CM

## 2013-10-07 DIAGNOSIS — C61 Malignant neoplasm of prostate: Secondary | ICD-10-CM

## 2013-10-07 LAB — COMPREHENSIVE METABOLIC PANEL (CC13)
ALT: 18 U/L (ref 0–55)
ANION GAP: 10 meq/L (ref 3–11)
AST: 20 U/L (ref 5–34)
Albumin: 3.9 g/dL (ref 3.5–5.0)
Alkaline Phosphatase: 69 U/L (ref 40–150)
BILIRUBIN TOTAL: 0.39 mg/dL (ref 0.20–1.20)
BUN: 17.5 mg/dL (ref 7.0–26.0)
CALCIUM: 8.9 mg/dL (ref 8.4–10.4)
CHLORIDE: 108 meq/L (ref 98–109)
CO2: 23 mEq/L (ref 22–29)
CREATININE: 0.9 mg/dL (ref 0.7–1.3)
Glucose: 132 mg/dl (ref 70–140)
Potassium: 3.9 mEq/L (ref 3.5–5.1)
Sodium: 141 mEq/L (ref 136–145)
Total Protein: 6.9 g/dL (ref 6.4–8.3)

## 2013-10-07 LAB — CBC WITH DIFFERENTIAL/PLATELET
BASO%: 0.4 % (ref 0.0–2.0)
Basophils Absolute: 0 10*3/uL (ref 0.0–0.1)
EOS ABS: 0 10*3/uL (ref 0.0–0.5)
EOS%: 0.6 % (ref 0.0–7.0)
HEMATOCRIT: 41.6 % (ref 38.4–49.9)
HGB: 13.5 g/dL (ref 13.0–17.1)
LYMPH%: 22.9 % (ref 14.0–49.0)
MCH: 30.3 pg (ref 27.2–33.4)
MCHC: 32.4 g/dL (ref 32.0–36.0)
MCV: 93.4 fL (ref 79.3–98.0)
MONO#: 0.3 10*3/uL (ref 0.1–0.9)
MONO%: 6.6 % (ref 0.0–14.0)
NEUT#: 2.9 10*3/uL (ref 1.5–6.5)
NEUT%: 69.5 % (ref 39.0–75.0)
PLATELETS: 146 10*3/uL (ref 140–400)
RBC: 4.45 10*6/uL (ref 4.20–5.82)
RDW: 13.8 % (ref 11.0–14.6)
WBC: 4.2 10*3/uL (ref 4.0–10.3)
lymph#: 1 10*3/uL (ref 0.9–3.3)

## 2013-10-07 NOTE — Telephone Encounter (Signed)
, °

## 2013-10-08 LAB — CEA: CEA: 0.9 ng/mL (ref 0.0–5.0)

## 2013-10-09 ENCOUNTER — Telehealth: Payer: Self-pay

## 2013-10-09 NOTE — Telephone Encounter (Signed)
Let pt know results of his CEA were normal.  Pt voiced understanding.

## 2013-10-09 NOTE — Progress Notes (Signed)
[facility] OFFICE PROGRESS NOTE  Patient Care Team: No Pcp Per Patient as PCP - General (General Practice) Dr. Johnathan Hausen  Dr. Arby Barrette  Dr. 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   SUMMARY OF ONCOLOGIC HISTORY: #1 rectal cancer: She was diagnosed with invasive adenocarcinoma of the rectum treated with concurrent neoadjuvant chemotherapy and radiation therapy. The neoadjuvant chemotherapy consisted of infusional 5-FU from May 2005 through June 2005. In August 2005 patient underwent abdominal perineal resection. With a colostomy.  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 Concurrently with radiation he received cis-platinum on a weekly basis from 05/13/2004 through December 2005.  #3 prostate cancer: Patient is status post prostate ectomy in 2002.   CURRENT THERAPY:Observation   INTERVAL HISTORY: Troy Brooks 73 y.o. male returns for followup visit today. Overall she's been doing well. He is seeing me every year. He wants to continue seeing me every year. He continues to work as well. He is working full-time. He has not noticed any problems whatsoever with hematochezia or melena hemoptysis or hematemesis. Has not noticed any masses in his neck. He has no difficulty in swallowing. No fevers chills or night sweats no weight loss. Remainder of the 10 point review of systems is negative and as below  I have reviewed the past medical history, past surgical history, social history and family history with the patient and they are unchanged  from previous note.  ALLERGIES:  is allergic to penicillins.  MEDICATIONS:  Current Outpatient Prescriptions  Medication Sig Dispense Refill  . levothyroxine (SYNTHROID, LEVOTHROID) 25 MCG tablet       . Multiple Vitamin (MULTIVITAMIN) tablet Take 1 tablet by mouth daily.      Marland Kitchen omeprazole (PRILOSEC) 20 MG capsule Take 20 mg by mouth daily as needed.      Oneta Rack Supplies (ADAPT) PSTE Apply as directed.  120 Tube  PRN  . Ostomy Supplies (OSTOMY DRAIN/MINI-POUCH/FLANGE) MISC 1 Units by Does not apply route 2 (two) times daily.  60 each  prn  . UNABLE TO FIND Apply 1 Device topically as needed. Med Hollister Skin barrier w/flange Item Number [id number]  Apply as directed  30 Device  PRN  . ranitidine (ZANTAC) 150 MG tablet Take 150 mg by mouth 2 (two) times daily.       No current facility-administered medications for this visit.    REVIEW OF SYSTEMS:   Constitutional: Denies fevers, chills or abnormal weight loss Eyes: Denies blurriness of vision Ears, nose, mouth, throat, and face: Denies mucositis or sore throat Respiratory: Denies cough, dyspnea or wheezes Cardiovascular: Denies palpitation, chest discomfort or lower extremity swelling Gastrointestinal:  Denies nausea, heartburn or change in bowel habits Skin: Denies abnormal skin rashes Lymphatics: Denies new lymphadenopathy or easy bruising Neurological:Denies numbness, tingling or new weaknesses Behavioral/Psych: Mood is stable, no new changes  All other systems were reviewed with the patient and are negative.  PHYSICAL EXAMINATION: ECOG PERFORMANCE STATUS: 0 - Asymptomatic  Filed Vitals:   10/07/13 1033  BP: 156/76  Pulse: 76  Temp: 98.3  F (36.8 C)  Resp: 18   Filed Weights   10/07/13 1033  Weight: 207 lb (93.895 kg)    GENERAL:alert, no distress and comfortable SKIN: skin color, texture, turgor are normal, no rashes or significant lesions EYES: normal, Conjunctiva are pink and non-injected, sclera  clear OROPHARYNX:no exudate, no erythema and lips, buccal mucosa, and tongue normal  NECK: supple, thyroid normal size, non-tender, without nodularity LYMPH:  no palpable lymphadenopathy in the cervical, axillary or inguinal LUNGS: clear to auscultation and percussion with normal breathing effort HEART: regular rate & rhythm and no murmurs and no lower extremity edema ABDOMEN:abdomen soft, non-tender and normal bowel sounds Musculoskeletal:no cyanosis of digits and no clubbing  NEURO: alert & oriented x 3 with fluent speech, no focal motor/sensory deficits  LABORATORY DATA:  I have reviewed the data as listed    Component Value Date/Time   NA 141 10/07/2013 1019   NA 139 08/02/2011 0911   NA 135 02/02/2010 1143   K 3.9 10/07/2013 1019   K 4.7 08/02/2011 0911   K 4.3 02/02/2010 1143   CL 104 08/01/2012 0833   CL 103 08/02/2011 0911   CL 99 02/02/2010 1143   CO2 23 10/07/2013 1019   CO2 25 08/02/2011 0911   CO2 29 02/02/2010 1143   GLUCOSE 132 10/07/2013 1019   GLUCOSE 108* 08/01/2012 0833   GLUCOSE 101* 08/02/2011 0911   GLUCOSE 92 02/02/2010 1143   BUN 17.5 10/07/2013 1019   BUN 11 08/02/2011 0911   BUN 14 02/02/2010 1143   CREATININE 0.9 10/07/2013 1019   CREATININE 0.95 08/02/2011 0911   CREATININE 1.1 02/02/2010 1143   CALCIUM 8.9 10/07/2013 1019   CALCIUM 9.3 08/02/2011 0911   CALCIUM 9.9 02/02/2010 1143   PROT 6.9 10/07/2013 1019   PROT 7.1 08/02/2011 0911   PROT 7.5 02/02/2010 1143   ALBUMIN 3.9 10/07/2013 1019   ALBUMIN 4.6 08/02/2011 0911   AST 20 10/07/2013 1019   AST 24 08/02/2011 0911   AST 24 02/02/2010 1143   ALT 18 10/07/2013 1019   ALT 20 08/02/2011 0911   ALT 22 02/02/2010 1143   ALKPHOS 69 10/07/2013 1019   ALKPHOS 50 08/02/2011 0911   ALKPHOS 60 02/02/2010 1143   BILITOT 0.39 10/07/2013 1019   BILITOT 0.4 08/02/2011 0911   BILITOT 0.80 02/02/2010 1143   GFRNONAA >60 12/27/2010 0450   GFRAA >60 12/27/2010 0450    No results found for this basename: SPEP, UPEP,  kappa and lambda light chains     Lab Results  Component Value Date   WBC 4.2 10/07/2013   NEUTROABS 2.9 10/07/2013   HGB 13.5 10/07/2013   HCT 41.6 10/07/2013   MCV 93.4 10/07/2013   PLT 146 10/07/2013      Chemistry      Component Value Date/Time   NA 141 10/07/2013 1019   NA 139 08/02/2011 0911   NA 135 02/02/2010 1143   K 3.9 10/07/2013 1019   K 4.7 08/02/2011 0911   K 4.3 02/02/2010 1143   CL 104 08/01/2012 0833   CL 103 08/02/2011 0911   CL 99 02/02/2010 1143   CO2 23 10/07/2013 1019   CO2 25 08/02/2011 0911   CO2 29 02/02/2010 1143   BUN 17.5 10/07/2013 1019   BUN 11 08/02/2011 0911   BUN 14 02/02/2010 1143   CREATININE 0.9 10/07/2013 1019   CREATININE 0.95 08/02/2011 0911   CREATININE 1.1 02/02/2010 1143      Component Value Date/Time  CALCIUM 8.9 10/07/2013 1019   CALCIUM 9.3 08/02/2011 0911   CALCIUM 9.9 02/02/2010 1143   ALKPHOS 69 10/07/2013 1019   ALKPHOS 50 08/02/2011 0911   ALKPHOS 60 02/02/2010 1143   AST 20 10/07/2013 1019   AST 24 08/02/2011 0911   AST 24 02/02/2010 1143   ALT 18 10/07/2013 1019   ALT 20 08/02/2011 0911   ALT 22 02/02/2010 1143   BILITOT 0.39 10/07/2013 1019   BILITOT 0.4 08/02/2011 0911   BILITOT 0.80 02/02/2010 1143       RADIOGRAPHIC STUDIES: I have personally reviewed the radiological images as listed and agreed with the findings in the report. No results found.    ASSESSMENT & PLAN:  73 year old gentleman weighing 3 primary cancers including rectal carcinoma that was invasive adenocarcinoma. He was treated with neoadjuvant concurrent chemoradiation therapy. Chemotherapy consisted of infusional 5-FU administered from May 2005 through June 2005. In August 2005 he underwent abdominoperineal resection with colostomy. He has no evidence of recurrent disease. Patient also was diagnosed with squamous cell carcinoma of the head and neck. He had biopsies of the base of the tongue in July 2005 as well as biopsies of the nasopharynx. 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 He  received IM RT. Concurrently he did receive cisplatin him on weekly basis from November 2005 through December 2005. Tolerated it well. He also has history of prostate cancer underwent a prostatectomy in 2002. No evidence of recurrent disease. He continues to do well. He is 10 years out from his original diagnosis of rectal cancer. He would like to be seen on an annual basis. We will continue to see him. He is doing well. He has all of his ostomy supplies. I continue to give him prescriptions periodically. He knows to call me with any problems.  Orders Placed This Encounter  Procedures  . CBC with Differential    Standing Status: Future     Number of Occurrences:      Standing Expiration Date: 10/07/2014  . Comprehensive metabolic panel (Cmet) - [facility]    Standing Status: Future     Number of Occurrences:      Standing Expiration Date: 10/07/2014  . CEA    Standing Status: Future     Number of Occurrences:      Standing Expiration Date: 10/07/2014   All questions were answered. The patient knows to call the clinic with any problems, questions or concerns. No barriers to learning was detected. I spent 15 minutes counseling the patient face to face. The total time spent in the appointment was 25 minutes and more than 50% was on counseling and review of test results     Marcy Panning, MD 10/09/2013 11:16 AM

## 2013-10-09 NOTE — Telephone Encounter (Signed)
Message copied by Prentiss Bells on Fri Oct 09, 2013 12:07 PM ------      Message from: Minette Headland      Created: Thu Oct 08, 2013 11:31 PM       Please call patient with normal results.            Thanks,       LC       ----- Message -----         From: [facility]         Sent: 10/07/2013  10:29 AM           To: Deatra Robinson, MD                   ------

## 2013-12-03 ENCOUNTER — Other Ambulatory Visit: Payer: Self-pay | Admitting: *Deleted

## 2013-12-03 DIAGNOSIS — C20 Malignant neoplasm of rectum: Secondary | ICD-10-CM

## 2013-12-03 MED ORDER — OSTOMY DRAIN/MINI-POUCH/FLANGE MISC: 1.0000 [IU] | Freq: Two times a day (BID) | Status: DC

## 2013-12-03 MED ORDER — ADAPT PSTE: PASTE | Status: DC

## 2013-12-04 ENCOUNTER — Other Ambulatory Visit: Payer: Self-pay | Admitting: *Deleted

## 2013-12-04 DIAGNOSIS — Z933 Colostomy status: Secondary | ICD-10-CM | POA: Insufficient documentation

## 2013-12-04 DIAGNOSIS — C20 Malignant neoplasm of rectum: Secondary | ICD-10-CM

## 2013-12-04 MED ORDER — OSTOMY DRAIN/MINI-POUCH/FLANGE MISC: 1.0000 [IU] | Freq: Two times a day (BID) | Status: DC

## 2013-12-04 MED ORDER — UNABLE TO FIND: 1.0000 | Status: DC | PRN

## 2013-12-04 MED ORDER — ADAPT PSTE: PASTE | Status: DC

## 2014-03-19 ENCOUNTER — Other Ambulatory Visit: Payer: Self-pay | Admitting: *Deleted

## 2014-03-19 DIAGNOSIS — C20 Malignant neoplasm of rectum: Secondary | ICD-10-CM

## 2014-04-30 ENCOUNTER — Telehealth: Payer: Self-pay | Admitting: Hematology and Oncology

## 2014-04-30 NOTE — Telephone Encounter (Signed)
Call to inform of appt chg from 4/1 (md pal) to 4/29 but pt's phone number on file is disconnected and there are no other contact numbers listed. I have mailed a revised appt calendar.

## 2014-05-09 HISTORY — PX: CHOLECYSTECTOMY: SHX55

## 2014-06-28 ENCOUNTER — Other Ambulatory Visit: Payer: Self-pay | Admitting: Oncology

## 2014-07-21 ENCOUNTER — Telehealth: Payer: Self-pay

## 2014-07-21 ENCOUNTER — Telehealth: Payer: Self-pay | Admitting: *Deleted

## 2014-07-21 NOTE — Telephone Encounter (Signed)
Received call from wife Constance Hackenberg wanting to know of follow up appt.  Called Joann back and left message on voice mail of scheduled appts with Dr. Lindi Adie on November 05, 2014. Joann's  Phone    [phone].

## 2014-07-23 NOTE — Telephone Encounter (Signed)
error 

## 2014-08-31 ENCOUNTER — Telehealth: Payer: Self-pay | Admitting: *Deleted

## 2014-08-31 ENCOUNTER — Other Ambulatory Visit: Payer: Self-pay

## 2014-08-31 DIAGNOSIS — Z933 Colostomy status: Secondary | ICD-10-CM

## 2014-08-31 NOTE — Telephone Encounter (Signed)
Spoke with patient about his stoma. Pt reports his stoma "has always been a little raw underneath" but this is worse.  Pt has not checked today.  Advised patient to go to ED if symptoms worsen or show signs of GI bleed (black, tarry stool).  Let pt know we would put in a referral to GI for him.  Pt voiced understanding.

## 2014-08-31 NOTE — Telephone Encounter (Signed)
Called patient after spouse called reporting change with his colostomy and needing earlier follow up than 11-05-2014 with new oncologist.  Mr. Musick "noticed a small amount of blood yesterday on the stoma and under the pouch.  Stoma appears raw.I am concerned about blood in stool.  Using same Hollister product Dr. Humphrey Rolls prescribed 10 years ago from [facility]."    Denies blood in stool at this point.  Asked that he call back when he empties if blood in stool.  Does not have a Copywriter, advertising.  Given number for [employer] to speak with [employer].  Potential return call if any blood in stool.  Patient is a Adult nurse and at work  Return call number [phone].

## 2014-09-01 ENCOUNTER — Telehealth: Payer: Self-pay | Admitting: Gastroenterology

## 2014-09-01 NOTE — Telephone Encounter (Signed)
This patient has a referral from the [facility] for a bleeding stoma.  Can you review the chart and advise on this referral.

## 2014-09-01 NOTE — Telephone Encounter (Signed)
Please check with [facility], one of their retired ([name]) docs made his rectal cancer diagnosis 10 years ago, they have possibly seen him since then for routine cancer surveillance colonoscopies. If they have not seen him since then, he needs next available appt with MD or extender.  Thanks

## 2014-09-01 NOTE — Telephone Encounter (Signed)
Dr Ardis Hughs is this something that needs to go to the surgeon?

## 2014-09-01 NOTE — Telephone Encounter (Signed)
Dustin I closed this by accident sorry, see Dr Ardis Hughs note.

## 2014-09-03 ENCOUNTER — Ambulatory Visit (INDEPENDENT_AMBULATORY_CARE_PROVIDER_SITE_OTHER): Payer: Medicare Other | Admitting: Nurse Practitioner

## 2014-09-03 ENCOUNTER — Encounter: Payer: Self-pay | Admitting: Nurse Practitioner

## 2014-09-03 VITALS — BP 140/80 | HR 60 | Ht 66.5 in | Wt 206.2 lb

## 2014-09-03 DIAGNOSIS — Z85048 Personal history of other malignant neoplasm of rectum, rectosigmoid junction, and anus: Secondary | ICD-10-CM

## 2014-09-03 DIAGNOSIS — L98499 Non-pressure chronic ulcer of skin of other sites with unspecified severity: Secondary | ICD-10-CM

## 2014-09-03 DIAGNOSIS — Z85038 Personal history of other malignant neoplasm of large intestine: Secondary | ICD-10-CM

## 2014-09-03 DIAGNOSIS — IMO0002 Reserved for concepts with insufficient information to code with codable children: Secondary | ICD-10-CM

## 2014-09-03 MED ORDER — MOVIPREP 100 G PO SOLR: 1.0000 | Freq: Once | ORAL | Status: DC

## 2014-09-03 NOTE — Progress Notes (Addendum)
HPI : Patient is a 75 year old male with a history of rectal cancer, status post resection with colostomy and chemoradiation 10 years ago. I do not have records but surgery apparently done by Dr. Hassell Done at [facility]. 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 Patient has not had a colonoscopy since his original one by [name] 10 years ago. Patient's oncologist was Dr. Humphrey Rolls who is apparently no longer practicing in this area. Patient has an upcoming appointment at the [facility] with a different Oncologist.   Patient comes in today for evaluation of blood in stool. He isn't sure whether bleeding is peristomal or intraluminal. No abdominal pain. No bowel changes.   Past Medical History  Diagnosis Date  . Prostate cancer   . Carcinoma of rectosigmoid junction   . Cancer   . GERD (gastroesophageal reflux disease)   . 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 Ovarian cancer Mother   . Colon cancer Sister   . Alzheimer's disease Father    History  Substance Use Topics  . Smoking status: Former Research scientist (life sciences)  . Smokeless tobacco: Never Used  . Alcohol Use: No   Current Outpatient Prescriptions  Medication Sig Dispense Refill  . levothyroxine (SYNTHROID, LEVOTHROID) 25 MCG tablet     . Multiple Vitamin (MULTIVITAMIN) tablet Take 1 tablet by mouth daily.    Marland Kitchen omeprazole (PRILOSEC) 20 MG capsule Take 20 mg by mouth daily as needed.    . Ostomy Supplies (ADAPT) PSTE APPLY TWICE DAILY XBM#84132 DX Z 93.3 60 Tube 5  . Ostomy Supplies (OSTOMY DRAIN/MINI-POUCH/FLANGE) MISC 1 Units by Does not apply route 2 (two) times daily. 60 each 11  . UNABLE TO FIND Apply 1 Device topically as needed. Med Hollister Skin barrier w/flange Item Number [id number]  Apply as directed 30 Device PRN   No current facility-administered medications for this  visit.   Allergies  Allergen Reactions  . Penicillins     Review of Systems: All systems reviewed and negative except where noted in HPI.   Physical Exam: BP 140/80 mmHg  Pulse 60  Ht 5' 6.5" (1.689 m)  Wt 206 lb 3.2 oz (93.532 kg)  BMI 32.79 kg/m2 Constitutional: Pleasant,well-developed, white male in no acute distress. HEENT: Normocephalic and atraumatic. Conjunctivae are normal. No scleral icterus. Neck supple.  Cardiovascular: Normal rate, regular rhythm.  Pulmonary/chest: Effort normal and breath sounds normal. No wheezing, rales or rhonchi. Abdominal: Soft, nondistended, nontender. Bowel sounds active throughout. There are no masses palpable. No hepatomegaly. LLQ stoma:  Bottom half of stoma erythematous, friable. Soft brown, non-bloody stool existing stoma.  Extremities: no edema Lymphadenopathy: No cervical adenopathy noted. Neurological: Alert and oriented to person place and time. Skin: Skin is warm and dry. No rashes noted. Psychiatric: Normal mood and affect. Behavior is normal.   ASSESSMENT AND PLAN:   72. 74 year old male with history of rectal cancer 10 years ago. Status post resection with colostomy and chemoradiation. Patient needs a colonoscopy, it has been 10 year since his last one. The risks, benefits, and alternatives to colonoscopy with possible biopsy and possible polypectomy were discussed with the patient and he consents to proceed.   2. Peristomal bleeding / irritation. I feel pretty certain that the blood in his bag is peristomal and not coming from within the bowel  itself. Will refer to Wound Ostomy Nurse for further evaluation and treatment.   Addendum: Reviewed and agree with initial management. Jerene Bears, MD

## 2014-09-03 NOTE — Patient Instructions (Addendum)
You have been scheduled for a colonoscopy. Please follow written instructions given to you at your visit today.  Please pick up your prep supplies at the pharmacy within the next 1-3 days. If you use inhalers (even only as needed), please bring them with you on the day of your procedure. Your physician has requested that you go to www.startemmi.com and enter the access code given to you at your visit today. This web site gives a general overview about your procedure. However, you should still follow specific instructions given to you by our office regarding your preparation for the procedure.   You have been set up to see a wound ostomy nurse on 09-10-14 at 11:00am. Marice Potter [phone] [address], [place]

## 2014-09-06 DIAGNOSIS — Z85038 Personal history of other malignant neoplasm of large intestine: Secondary | ICD-10-CM | POA: Insufficient documentation

## 2014-09-06 DIAGNOSIS — IMO0002 Reserved for concepts with insufficient information to code with codable children: Secondary | ICD-10-CM | POA: Insufficient documentation

## 2014-09-23 ENCOUNTER — Ambulatory Visit (AMBULATORY_SURGERY_CENTER): Payer: Medicare Other | Admitting: Internal Medicine

## 2014-09-23 ENCOUNTER — Encounter: Payer: Self-pay | Admitting: Internal Medicine

## 2014-09-23 VITALS — BP 130/64 | HR 67 | Temp 97.1°F | Resp 18 | Ht 66.5 in | Wt 206.0 lb

## 2014-09-23 DIAGNOSIS — Z85038 Personal history of other malignant neoplasm of large intestine: Secondary | ICD-10-CM

## 2014-09-23 DIAGNOSIS — D124 Benign neoplasm of descending colon: Secondary | ICD-10-CM

## 2014-09-23 MED ORDER — SODIUM CHLORIDE 0.9 % IV SOLN: 500.0000 mL | INTRAVENOUS | Status: DC

## 2014-09-23 NOTE — Op Note (Signed)
[place]  [address] [place], [id number]   COLONOSCOPY PROCEDURE REPORT  PATIENT: Troy Brooks, Troy Brooks  MR#: [id number] BIRTHDATE: 07-Dec-1940 , 59  yrs. old GENDER: male ENDOSCOPIST: Jerene Bears, MD PROCEDURE DATE:  09/23/2014 PROCEDURE:   Colonoscopy with cold biopsy polypectomy First Screening Colonoscopy - Avg.  risk and is 50 yrs.  old or older - No.  Prior Negative Screening - Now for repeat screening. N/A  History of Adenoma - Now for follow-up colonoscopy & has been > or = to 3 yrs.  Yes hx of adenoma.  Has been 3 or more years since last colonoscopy. ASA CLASS:   Class III INDICATIONS:Surveillance due to prior colonic neoplasia and PH Colon or Rectal Adenocarcinoma with permanent end colostomy. MEDICATIONS: Monitored anesthesia care and Propofol 200 mg IV  DESCRIPTION OF PROCEDURE:   After the risks benefits and alternatives of the procedure were thoroughly explained, informed consent was obtained.  The digital rectal exam revealed no abnormalities of the rectum.   The LB [id number]  endoscope was introduced through the anus and advanced to the cecum, which was identified by both the appendix and ileocecal valve. No adverse events experienced.   The quality of the prep was (MoviPrep was used) excellent.  The instrument was then slowly withdrawn as the colon was fully examined.   COLON FINDINGS: Permanent end colostomy entered without difficulty. Two sessile polyps ranging between 3-70mm in size were found in the descending colon.  Polypectomies were performed with cold forceps. The resection was complete, the polyp tissue was completely retrieved and sent to histology.   The examination was otherwise normal.  Retroflexion was not performed (ostomy pt). The time to cecum = 1.65min Withdrawal time =   9.0 min   The scope was withdrawn and the procedure completed. COMPLICATIONS: There were no immediate complications.  ENDOSCOPIC IMPRESSION: 1.    Two sessile polyps ranging between 3-32mm in size were found in the descending colon; polypectomies were performed with cold forceps 2.   The examination was otherwise normal  RECOMMENDATIONS: 1.  Await pathology results 2.  Repeat Colonoscopy in 5 years. 3.  You will receive a letter within 1-2 weeks with the results of your biopsy as well as final recommendations.  Please call my office if you have not received a letter after 3 weeks.  eSigned:  Jerene Bears, MD 09/23/2014 4:00 PM   cc: the patient

## 2014-09-23 NOTE — Progress Notes (Signed)
Patient awakening,vss,report to rn 

## 2014-09-23 NOTE — Progress Notes (Signed)
Patient and wife reapplying colostomy bag prior to discharge.

## 2014-09-23 NOTE — Patient Instructions (Signed)
YOU HAD AN ENDOSCOPIC PROCEDURE TODAY AT THE [facility]:   Refer to the procedure report that was given to you for any specific questions about what was found during the examination.  If the procedure report does not answer your questions, please call your gastroenterologist to clarify.  If you requested that your care partner not be given the details of your procedure findings, then the procedure report has been included in a sealed envelope for you to review at your convenience later.  YOU SHOULD EXPECT: Some feelings of bloating in the abdomen. Passage of more gas than usual.  Walking can help get rid of the air that was put into your GI tract during the procedure and reduce the bloating. If you had a lower endoscopy (such as a colonoscopy or flexible sigmoidoscopy) you may notice spotting of blood in your stool or on the toilet paper. If you underwent a bowel prep for your procedure, you may not have a normal bowel movement for a few days.  Please Note:  You might notice some irritation and congestion in your nose or some drainage.  This is from the oxygen used during your procedure.  There is no need for concern and it should clear up in a day or so.  SYMPTOMS TO REPORT IMMEDIATELY:   Following lower endoscopy (colonoscopy or flexible sigmoidoscopy):  Excessive amounts of blood in the stool  Significant tenderness or worsening of abdominal pains  Swelling of the abdomen that is new, acute  Fever of 100F or higher   For urgent or emergent issues, a gastroenterologist can be reached at any hour by calling [phone].   DIET: Your first meal following the procedure should be a small meal and then it is ok to progress to your normal diet. Heavy or fried foods are harder to digest and may make you feel nauseous or bloated.  Likewise, meals heavy in dairy and vegetables can increase bloating.  Drink plenty of fluids but you should avoid alcoholic beverages for 24  hours.  ACTIVITY:  You should plan to take it easy for the rest of today and you should NOT DRIVE or use heavy machinery until tomorrow (because of the sedation medicines used during the test).    FOLLOW UP: Our staff will call the number listed on your records the next business day following your procedure to check on you and address any questions or concerns that you may have regarding the information given to you following your procedure. If we do not reach you, we will leave a message.  However, if you are feeling well and you are not experiencing any problems, there is no need to return our call.  We will assume that you have returned to your regular daily activities without incident.  If any biopsies were taken you will be contacted by phone or by letter within the next 1-3 weeks.  Please call us at [phone] if you have not heard about the biopsies in 3 weeks.    SIGNATURES/CONFIDENTIALITY: You and/or your care partner have signed paperwork which will be entered into your electronic medical record.  These signatures attest to the fact that that the information above on your After Visit Summary has been reviewed and is understood.  Full responsibility of the confidentiality of this discharge information lies with you and/or your care-partner. 

## 2014-09-23 NOTE — Progress Notes (Signed)
Called to room to assist during endoscopic procedure.  Patient ID and intended procedure confirmed with present staff. Received instructions for my participation in the procedure from the performing physician.  

## 2014-09-24 ENCOUNTER — Telehealth: Payer: Self-pay | Admitting: *Deleted

## 2014-09-24 NOTE — Telephone Encounter (Signed)
  Follow up Call-  Call back number 09/23/2014  Post procedure Call Back phone  # [phone]  Permission to leave phone message Yes     Patient questions:  Do you have a fever, pain , or abdominal swelling? No. Pain Score  0 *  Have you tolerated food without any problems? Yes.    Have you been able to return to your normal activities? Yes.    Do you have any questions about your discharge instructions: Diet   No. Medications  No. Follow up visit  No.  Do you have questions or concerns about your Care? No.  Actions: * If pain score is 4 or above: No action needed, pain <4.

## 2014-09-28 ENCOUNTER — Encounter: Payer: Self-pay | Admitting: Internal Medicine

## 2014-10-08 ENCOUNTER — Ambulatory Visit: Payer: Medicare Other | Admitting: Hematology and Oncology

## 2014-10-08 ENCOUNTER — Other Ambulatory Visit: Payer: Medicare Other

## 2014-11-04 ENCOUNTER — Other Ambulatory Visit: Payer: Self-pay | Admitting: *Deleted

## 2014-11-04 DIAGNOSIS — C20 Malignant neoplasm of rectum: Secondary | ICD-10-CM

## 2014-11-05 ENCOUNTER — Ambulatory Visit (HOSPITAL_BASED_OUTPATIENT_CLINIC_OR_DEPARTMENT_OTHER): Payer: Medicare Other | Admitting: Hematology and Oncology

## 2014-11-05 ENCOUNTER — Other Ambulatory Visit (HOSPITAL_BASED_OUTPATIENT_CLINIC_OR_DEPARTMENT_OTHER): Payer: Medicare Other

## 2014-11-05 ENCOUNTER — Other Ambulatory Visit: Payer: Self-pay | Admitting: *Deleted

## 2014-11-05 ENCOUNTER — Telehealth: Payer: Self-pay | Admitting: Hematology and Oncology

## 2014-11-05 VITALS — BP 151/71 | HR 89 | Temp 98.7°F | Resp 18 | Ht 66.5 in | Wt 213.1 lb

## 2014-11-05 DIAGNOSIS — C61 Malignant neoplasm of prostate: Secondary | ICD-10-CM

## 2014-11-05 DIAGNOSIS — Z8546 Personal history of malignant neoplasm of prostate: Secondary | ICD-10-CM | POA: Diagnosis not present

## 2014-11-05 DIAGNOSIS — C760 Malignant neoplasm of head, face and neck: Secondary | ICD-10-CM

## 2014-11-05 DIAGNOSIS — Z85048 Personal history of other malignant neoplasm of rectum, rectosigmoid junction, and anus: Secondary | ICD-10-CM | POA: Diagnosis present

## 2014-11-05 DIAGNOSIS — Z8589 Personal history of malignant neoplasm of other organs and systems: Secondary | ICD-10-CM | POA: Diagnosis not present

## 2014-11-05 DIAGNOSIS — C20 Malignant neoplasm of rectum: Secondary | ICD-10-CM

## 2014-11-05 LAB — CBC WITH DIFFERENTIAL/PLATELET
BASO%: 0.6 % (ref 0.0–2.0)
BASOS ABS: 0 10*3/uL (ref 0.0–0.1)
EOS ABS: 0 10*3/uL (ref 0.0–0.5)
EOS%: 0.9 % (ref 0.0–7.0)
HCT: 41.4 % (ref 38.4–49.9)
HEMOGLOBIN: 13.4 g/dL (ref 13.0–17.1)
LYMPH#: 1.1 10*3/uL (ref 0.9–3.3)
LYMPH%: 26.6 % (ref 14.0–49.0)
MCH: 30 pg (ref 27.2–33.4)
MCHC: 32.3 g/dL (ref 32.0–36.0)
MCV: 92.7 fL (ref 79.3–98.0)
MONO#: 0.1 10*3/uL (ref 0.1–0.9)
MONO%: 3 % (ref 0.0–14.0)
NEUT#: 2.7 10*3/uL (ref 1.5–6.5)
NEUT%: 68.9 % (ref 39.0–75.0)
Platelets: 136 10*3/uL — ABNORMAL LOW (ref 140–400)
RBC: 4.46 10*6/uL (ref 4.20–5.82)
RDW: 13.8 % (ref 11.0–14.6)
WBC: 3.9 10*3/uL — ABNORMAL LOW (ref 4.0–10.3)

## 2014-11-05 LAB — COMPREHENSIVE METABOLIC PANEL (CC13)
ALK PHOS: 69 U/L (ref 40–150)
ALT: 19 U/L (ref 0–55)
AST: 18 U/L (ref 5–34)
Albumin: 3.7 g/dL (ref 3.5–5.0)
Anion Gap: 11 mEq/L (ref 3–11)
BILIRUBIN TOTAL: 0.37 mg/dL (ref 0.20–1.20)
BUN: 15.8 mg/dL (ref 7.0–26.0)
CALCIUM: 9 mg/dL (ref 8.4–10.4)
CHLORIDE: 105 meq/L (ref 98–109)
CO2: 24 mEq/L (ref 22–29)
Creatinine: 1 mg/dL (ref 0.7–1.3)
EGFR: 77 mL/min/{1.73_m2} — ABNORMAL LOW (ref >90)
Glucose: 145 mg/dl — ABNORMAL HIGH (ref 70–140)
Potassium: 4 mEq/L (ref 3.5–5.1)
SODIUM: 141 meq/L (ref 136–145)
TOTAL PROTEIN: 6.8 g/dL (ref 6.4–8.3)

## 2014-11-05 NOTE — Assessment & Plan Note (Signed)
Rectal adenocarcinoma treated with concurrent neoadjuvant chemoradiation with infusional 5-FU May 2005 through June 2005 followed by AP resection August 2005 at the colostomy, no evidence of recurrent disease  Surveillance: 1. Since he is 11 years from initial diagnosis that is notable of protein imaging studies. 2. It is reasonable to obtain an annual CEA.  Squamous cell carcinoma the head and neck: July 2005 treated with cisplatin with radiation, no evidence of recurrent disease, no role of imaging studies patient is asymptomatic.  Prostate cancer 2002: Treat with prostatectomy

## 2014-11-05 NOTE — Progress Notes (Signed)
Patient Care Team: No Pcp Per Patient as PCP - General (General Practice)   SUMMARY OF ONCOLOGIC HISTORY:   Rectal adenocarcinoma   11/23/2003 - 01/05/2004 Neo-Adjuvant Chemotherapy Neoadjuvant chemotherapy with infusional 5-FU with radiation   02/22/2004 Surgery Abdominal perineal resection with a colostomy    Prostate cancer   11/04/2000 Surgery Prostatectomy    Primary cancer of head and neck   01/25/2004 Initial Diagnosis Squamous cell carcinoma of the left neck   05/11/2004 - 06/21/2004 Chemotherapy Concurrent chemoradiation with weekly cisplatin    CHIEF COMPLIANT: Follow-up of rectal, head and neck, prostate cancers  INTERVAL HISTORY: Troy Brooks is a 74 year old gentleman with above-mentioned history of rectum and had a neck cancers diagnosed in 2005. He underwent concurrent chemoradiation with 5-FU for the rectal cancer followed by AP resection. He tells me that he had a stage III disease. He did not get any adjuvant chemotherapy. 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 No primary was ever identified in spite of multiple biopsies. He underwent cisplatin with radiation followed by surgery. He is in remission from all of his cancers. He has no new complaints or concerns. He is still working full-time for a Copywriter, advertising. He had a recent colonoscopy that remain a few polyps are otherwise normal.  REVIEW OF SYSTEMS:   Constitutional: Denies fevers, chills or abnormal weight loss Eyes: Denies blurriness of vision Ears, nose, mouth, throat, and face: Denies mucositis or sore throat Respiratory: Denies cough, dyspnea or wheezes Cardiovascular: Denies palpitation, chest discomfort or lower extremity swelling Gastrointestinal:  Denies nausea, heartburn or change in bowel habits Skin: Denies abnormal skin rashes Lymphatics: Denies new lymphadenopathy or easy  bruising Neurological:Denies numbness, tingling or new weaknesses Behavioral/Psych: Mood is stable, no new changes  All other systems were reviewed with the patient and are negative.  I have reviewed the past medical history, past surgical history, social history and family history with the patient and they are unchanged from previous note.  ALLERGIES:  is allergic to penicillins.  MEDICATIONS:  Current Outpatient Prescriptions  Medication Sig Dispense Refill  . levothyroxine (SYNTHROID, LEVOTHROID) 25 MCG tablet     . Multiple Vitamin (MULTIVITAMIN) tablet Take 1 tablet by mouth daily.    Marland Kitchen omeprazole (PRILOSEC) 20 MG capsule Take 20 mg by mouth daily as needed.    . Ostomy Supplies (ADAPT) PSTE APPLY TWICE DAILY ATF#57322 DX Z 93.3 60 Tube 5  . Ostomy Supplies (OSTOMY DRAIN/MINI-POUCH/FLANGE) MISC 1 Units by Does not apply route 2 (two) times daily. 60 each 11  . UNABLE TO FIND Apply 1 Device topically as needed. Med Hollister Skin barrier w/flange Item Number [id number]  Apply as directed 30 Device PRN   No current facility-administered medications for this visit.    PHYSICAL EXAMINATION: ECOG PERFORMANCE STATUS: 0 - Asymptomatic  Filed Vitals:   11/05/14 1021  BP: 151/71  Pulse: 89  Temp: 98.7 F (37.1 C)  Resp: 18   Filed Weights   11/05/14 1021  Weight: 213 lb 1 oz (96.645 kg)    GENERAL:alert, no distress and comfortable SKIN: skin color, texture, turgor are normal, no rashes or significant lesions EYES: normal, Conjunctiva are pink and non-injected, sclera clear OROPHARYNX:no exudate, no erythema and lips, buccal mucosa, and tongue normal  NECK: supple, thyroid normal size, non-tender, without nodularity LYMPH:  no palpable lymphadenopathy in the cervical,  axillary or inguinal LUNGS: clear to auscultation and percussion with normal breathing effort HEART: regular rate & rhythm and no murmurs and no lower extremity edema ABDOMEN:abdomen soft, non-tender and normal  bowel sounds Musculoskeletal:no cyanosis of digits and no clubbing  NEURO: alert & oriented x 3 with fluent speech, no focal motor/sensory deficits  LABORATORY DATA:  I have reviewed the data as listed   Chemistry      Component Value Date/Time   NA 141 11/05/2014 1002   NA 139 08/02/2011 0911   NA 135 02/02/2010 1143   K 4.0 11/05/2014 1002   K 4.7 08/02/2011 0911   K 4.3 02/02/2010 1143   CL 104 08/01/2012 0833   CL 103 08/02/2011 0911   CL 99 02/02/2010 1143   CO2 24 11/05/2014 1002   CO2 25 08/02/2011 0911   CO2 29 02/02/2010 1143   BUN 15.8 11/05/2014 1002   BUN 11 08/02/2011 0911   BUN 14 02/02/2010 1143   CREATININE 1.0 11/05/2014 1002   CREATININE 0.95 08/02/2011 0911   CREATININE 1.1 02/02/2010 1143      Component Value Date/Time   CALCIUM 9.0 11/05/2014 1002   CALCIUM 9.3 08/02/2011 0911   CALCIUM 9.9 02/02/2010 1143   ALKPHOS 69 11/05/2014 1002   ALKPHOS 50 08/02/2011 0911   ALKPHOS 60 02/02/2010 1143   AST 18 11/05/2014 1002   AST 24 08/02/2011 0911   AST 24 02/02/2010 1143   ALT 19 11/05/2014 1002   ALT 20 08/02/2011 0911   ALT 22 02/02/2010 1143   BILITOT 0.37 11/05/2014 1002   BILITOT 0.4 08/02/2011 0911   BILITOT 0.80 02/02/2010 1143       Lab Results  Component Value Date   WBC 3.9* 11/05/2014   HGB 13.4 11/05/2014   HCT 41.4 11/05/2014   MCV 92.7 11/05/2014   PLT 136* 11/05/2014   NEUTROABS 2.7 11/05/2014   ASSESSMENT & PLAN:  Rectal adenocarcinoma Rectal adenocarcinoma treated with concurrent neoadjuvant chemoradiation with infusional 5-FU May 2005 through June 2005 followed by AP resection August 2005 at the colostomy, no evidence of recurrent disease, the patient reports that he had stage III disease.  Surveillance: 1. Since he is 11 years from initial diagnosis that is notable of protein imaging studies. 2. It is reasonable to obtain an annual CEA.  Squamous cell carcinoma the head and neck: Found as a neck lymph node without any  primary when he went to get a PET CT scan for his rectal cancer. In July 2005, he was treated with neoadjuvant radiation followed by surgery followed by adjuvant cisplatin with radiation, no evidence of recurrent disease, no role of imaging studies patient is asymptomatic.  Prostate cancer 2002: Treated with prostatectomy   I reviewed his lab work from today unfortunately he did not get and PSA drawn. I provided him with a prescription to get those added to his next blood work that he would do at his primary care doctor's office.    Orders Placed This Encounter  Procedures  . CBC with Differential    Standing Status: Future     Number of Occurrences:      Standing Expiration Date: 11/05/2015  . Comprehensive metabolic panel (Cmet) - [facility]    Standing Status: Future     Number of Occurrences:      Standing Expiration Date: 11/05/2015  . CEA    Standing Status: Future     Number of Occurrences:      Standing Expiration Date: 12/10/2015  .  PSA    Standing Status: Future     Number of Occurrences:      Standing Expiration Date: 12/10/2015   The patient has a good understanding of the overall plan. he agrees with it. he will call with any problems that may develop before his next visit here.   Rulon Eisenmenger, MD

## 2014-11-05 NOTE — Telephone Encounter (Signed)
per pof to sch pt appt-gave pt copy of sch °

## 2014-11-26 ENCOUNTER — Telehealth: Payer: Self-pay

## 2014-11-26 NOTE — Telephone Encounter (Signed)
Lab results dtd 11/18/14 received from [facility]  Reviewed by Dr. Lindi Adie.  Sent to scan.

## 2014-12-17 ENCOUNTER — Other Ambulatory Visit: Payer: Self-pay

## 2014-12-17 DIAGNOSIS — C61 Malignant neoplasm of prostate: Secondary | ICD-10-CM

## 2014-12-17 DIAGNOSIS — Z933 Colostomy status: Secondary | ICD-10-CM

## 2014-12-17 DIAGNOSIS — Z85038 Personal history of other malignant neoplasm of large intestine: Secondary | ICD-10-CM

## 2014-12-17 DIAGNOSIS — C760 Malignant neoplasm of head, face and neck: Secondary | ICD-10-CM

## 2014-12-17 DIAGNOSIS — C20 Malignant neoplasm of rectum: Secondary | ICD-10-CM

## 2014-12-17 MED ORDER — ADAPT PSTE: PASTE | Status: DC

## 2015-01-17 ENCOUNTER — Other Ambulatory Visit: Payer: Self-pay | Admitting: *Deleted

## 2015-01-17 DIAGNOSIS — Z85038 Personal history of other malignant neoplasm of large intestine: Secondary | ICD-10-CM

## 2015-01-17 DIAGNOSIS — C20 Malignant neoplasm of rectum: Secondary | ICD-10-CM

## 2015-01-17 DIAGNOSIS — C760 Malignant neoplasm of head, face and neck: Secondary | ICD-10-CM

## 2015-01-17 DIAGNOSIS — Z933 Colostomy status: Secondary | ICD-10-CM

## 2015-01-17 DIAGNOSIS — C61 Malignant neoplasm of prostate: Secondary | ICD-10-CM

## 2015-01-17 MED ORDER — ADAPT PSTE: PASTE | Status: DC

## 2015-08-22 ENCOUNTER — Other Ambulatory Visit: Payer: Self-pay

## 2015-08-22 DIAGNOSIS — C61 Malignant neoplasm of prostate: Secondary | ICD-10-CM

## 2015-08-22 DIAGNOSIS — C20 Malignant neoplasm of rectum: Secondary | ICD-10-CM

## 2015-08-22 DIAGNOSIS — Z85038 Personal history of other malignant neoplasm of large intestine: Secondary | ICD-10-CM

## 2015-08-22 DIAGNOSIS — Z933 Colostomy status: Secondary | ICD-10-CM

## 2015-08-22 DIAGNOSIS — C760 Malignant neoplasm of head, face and neck: Secondary | ICD-10-CM

## 2015-08-22 MED ORDER — ADAPT PSTE: PASTE | Status: DC

## 2015-09-19 ENCOUNTER — Other Ambulatory Visit: Payer: Self-pay | Admitting: *Deleted

## 2015-09-19 ENCOUNTER — Telehealth: Payer: Self-pay | Admitting: *Deleted

## 2015-09-19 NOTE — Telephone Encounter (Signed)
"  [facility] calling.  Was request for ostomy supplies received to fax [phone]?  Would like to leave a verbal request for [facility], skin barrier and pouch.  We need these orders by tomorrow morning at the latest."  Will notify provider.

## 2015-09-20 ENCOUNTER — Other Ambulatory Visit: Payer: Self-pay | Admitting: Oncology

## 2015-09-20 ENCOUNTER — Other Ambulatory Visit: Payer: Self-pay | Admitting: *Deleted

## 2015-09-20 DIAGNOSIS — C20 Malignant neoplasm of rectum: Secondary | ICD-10-CM

## 2015-09-20 DIAGNOSIS — Z85038 Personal history of other malignant neoplasm of large intestine: Secondary | ICD-10-CM

## 2015-09-20 DIAGNOSIS — C760 Malignant neoplasm of head, face and neck: Secondary | ICD-10-CM

## 2015-09-20 DIAGNOSIS — C61 Malignant neoplasm of prostate: Secondary | ICD-10-CM

## 2015-09-20 DIAGNOSIS — Z933 Colostomy status: Secondary | ICD-10-CM

## 2015-09-20 MED ORDER — OSTOMY DRAIN/MINI-POUCH/FLANGE MISC: 1.0000 [IU] | Freq: Two times a day (BID) | Status: DC

## 2015-09-20 MED ORDER — UNABLE TO FIND: 1.0000 | Status: DC | PRN

## 2015-09-20 MED ORDER — ADAPT PSTE
PASTE | Status: DC
Start: 2015-09-20 — End: 2017-12-04

## 2015-09-20 NOTE — Telephone Encounter (Signed)
Please send the verbal orders for the supplies [facility]

## 2015-09-23 NOTE — Telephone Encounter (Signed)
Order faxed to [facility].

## 2015-11-03 ENCOUNTER — Telehealth: Payer: Self-pay | Admitting: *Deleted

## 2015-11-03 NOTE — Telephone Encounter (Signed)
Voicemail from patient stating "I just missed a call from there.  No message left." Called patient to inform him he is scheduled for F/U 11-07-2015 at 10:30 am.  No further questions.

## 2015-11-07 ENCOUNTER — Other Ambulatory Visit: Payer: Self-pay | Admitting: *Deleted

## 2015-11-07 ENCOUNTER — Ambulatory Visit (HOSPITAL_BASED_OUTPATIENT_CLINIC_OR_DEPARTMENT_OTHER): Payer: Medicare Other | Admitting: Hematology and Oncology

## 2015-11-07 ENCOUNTER — Other Ambulatory Visit: Payer: Medicare Other

## 2015-11-07 ENCOUNTER — Telehealth: Payer: Self-pay | Admitting: Hematology and Oncology

## 2015-11-07 ENCOUNTER — Encounter: Payer: Self-pay | Admitting: Hematology and Oncology

## 2015-11-07 VITALS — BP 160/74 | HR 99 | Temp 97.9°F | Resp 19 | Ht 66.5 in | Wt 215.7 lb

## 2015-11-07 DIAGNOSIS — C8589 Other specified types of non-Hodgkin lymphoma, extranodal and solid organ sites: Secondary | ICD-10-CM | POA: Diagnosis not present

## 2015-11-07 DIAGNOSIS — Z8546 Personal history of malignant neoplasm of prostate: Secondary | ICD-10-CM

## 2015-11-07 DIAGNOSIS — C20 Malignant neoplasm of rectum: Secondary | ICD-10-CM

## 2015-11-07 DIAGNOSIS — C61 Malignant neoplasm of prostate: Secondary | ICD-10-CM

## 2015-11-07 DIAGNOSIS — C760 Malignant neoplasm of head, face and neck: Secondary | ICD-10-CM

## 2015-11-07 DIAGNOSIS — Z85048 Personal history of other malignant neoplasm of rectum, rectosigmoid junction, and anus: Secondary | ICD-10-CM

## 2015-11-07 LAB — CBC WITH DIFFERENTIAL/PLATELET
BASO%: 0.2 % (ref 0.0–2.0)
Basophils Absolute: 0 10*3/uL (ref 0.0–0.1)
EOS ABS: 0.1 10*3/uL (ref 0.0–0.5)
EOS%: 0.9 % (ref 0.0–7.0)
HCT: 42.9 % (ref 38.4–49.9)
HGB: 14.1 g/dL (ref 13.0–17.1)
LYMPH%: 25 % (ref 14.0–49.0)
MCH: 31.3 pg (ref 27.2–33.4)
MCHC: 32.9 g/dL (ref 32.0–36.0)
MCV: 95.1 fL (ref 79.3–98.0)
MONO#: 0.3 10*3/uL (ref 0.1–0.9)
MONO%: 4.9 % (ref 0.0–14.0)
NEUT#: 3.6 10*3/uL (ref 1.5–6.5)
NEUT%: 69 % (ref 39.0–75.0)
PLATELETS: 154 10*3/uL (ref 140–400)
RBC: 4.51 10*6/uL (ref 4.20–5.82)
RDW: 13.7 % (ref 11.0–14.6)
WBC: 5.3 10*3/uL (ref 4.0–10.3)
lymph#: 1.3 10*3/uL (ref 0.9–3.3)

## 2015-11-07 NOTE — Telephone Encounter (Signed)
appt made and avs printed °

## 2015-11-07 NOTE — Progress Notes (Signed)
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 He is here for annual checkup. He reports his health has been excellent. He does not have any problems with bowels or urination. He denies any problems with swallowing or speech or pain in the neck. 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 He does have vitiligo on both of his arms. Denies any other symptoms or concerns.  REVIEW OF SYSTEMS:   Constitutional: Denies fevers, chills or abnormal weight loss Eyes: Denies blurriness of vision Ears, nose, mouth, throat, and face: Denies mucositis or sore throat Respiratory: Denies cough, dyspnea or wheezes Cardiovascular: Denies palpitation, chest discomfort Gastrointestinal:  Denies nausea, heartburn or change in bowel habits Skin: Denies abnormal skin rashes Lymphatics: Denies new lymphadenopathy or easy bruising Neurological:Denies numbness, tingling or new weaknesses Behavioral/Psych: Mood is stable, no new changes  Extremities: No lower extremity edema All other systems were reviewed with the patient and are negative.  I have reviewed the past medical history, past surgical history,  social history and family history with the patient and they are unchanged from previous note.  ALLERGIES:  is allergic to penicillins.  MEDICATIONS:  Current Outpatient Prescriptions  Medication Sig Dispense Refill  . levothyroxine (SYNTHROID, LEVOTHROID) 25 MCG tablet     . Multiple Vitamin (MULTIVITAMIN) tablet Take 1 tablet by mouth daily.    Marland Kitchen omeprazole (PRILOSEC) 20 MG capsule Take 20 mg by mouth daily as needed.    . Ostomy Supplies (ADAPT) PSTE APPLY TWICE DAILY [id number] DX Z 93.3 60 Tube 11  . Ostomy Supplies (OSTOMY DRAIN/MINI-POUCH/FLANGE) MISC 1 Units by Does not apply route 2 (two) times daily. 60 each 11  . UNABLE TO FIND Apply 1 Device topically as needed. Med [employer] Skin barrier w/flange Item Number [id number]  Apply as directed 30 Device 3   No current facility-administered medications for this visit.    PHYSICAL EXAMINATION: ECOG PERFORMANCE STATUS: 0 - Asymptomatic  Filed Vitals:   11/07/15 1027  BP: 160/74  Pulse: 99  Temp: 97.9 F (36.6 C)  Resp: 19   Filed Weights   11/07/15 1027  Weight: 215 lb 11.2 oz (97.841 kg)    GENERAL:alert, no distress and comfortable SKIN: skin color, texture, turgor are normal, no rashes or significant lesions EYES: normal, Conjunctiva are pink and non-injected, sclera clear OROPHARYNX:no exudate, no erythema and lips, buccal mucosa, and tongue normal  NECK: supple, thyroid normal size, non-tender, without nodularity LYMPH:  no palpable lymphadenopathy in the cervical, axillary or inguinal LUNGS: clear to auscultation and percussion with normal breathing effort HEART: regular rate & rhythm and no murmurs and no lower extremity edema ABDOMEN:abdomen soft, non-tender and normal bowel sounds MUSCULOSKELETAL:no cyanosis of digits and no clubbing  NEURO: alert & oriented x  3 with fluent speech, no focal motor/sensory deficits EXTREMITIES: No lower extremity edema  LABORATORY DATA:  I have reviewed the data as listed    Chemistry      Component Value Date/Time   NA 141 11/05/2014 1002   NA 139 08/02/2011 0911   NA 135 02/02/2010 1143   K 4.0 11/05/2014 1002   K 4.7 08/02/2011 0911   K 4.3 02/02/2010 1143   CL 104 08/01/2012 0833   CL 103 08/02/2011 0911   CL 99 02/02/2010 1143   CO2 24 11/05/2014 1002   CO2 25 08/02/2011 0911   CO2 29 02/02/2010 1143   BUN 15.8 11/05/2014 1002   BUN 11 08/02/2011 0911   BUN 14 02/02/2010 1143   CREATININE 1.0 11/05/2014 1002   CREATININE 0.95 08/02/2011 0911   CREATININE 1.1 02/02/2010 1143      Component Value Date/Time   CALCIUM 9.0 11/05/2014 1002   CALCIUM 9.3 08/02/2011 0911   CALCIUM 9.9 02/02/2010 1143   ALKPHOS 69 11/05/2014 1002   ALKPHOS 50 08/02/2011 0911   ALKPHOS 60 02/02/2010 1143   AST 18 11/05/2014 1002   AST 24 08/02/2011 0911   AST 24 02/02/2010 1143   ALT 19 11/05/2014 1002   ALT 20 08/02/2011 0911   ALT 22 02/02/2010 1143   BILITOT 0.37 11/05/2014 1002   BILITOT 0.4 08/02/2011 0911   BILITOT 0.80 02/02/2010 1143       Lab Results  Component Value Date   WBC 3.9* 11/05/2014   HGB 13.4 11/05/2014   HCT 41.4 11/05/2014   MCV 92.7 11/05/2014   PLT 136* 11/05/2014   NEUTROABS 2.7 11/05/2014     ASSESSMENT & PLAN:  Rectal adenocarcinoma Rectal adenocarcinoma treated with concurrent neoadjuvant chemoradiation with infusional 5-FU May 2005 through June 2005 followed by AP resection August 2005 at the colostomy, no evidence of recurrent disease, the patient reports that he had stage III disease.  Surveillance: 1. Since he is 12 years from initial diagnosis (no role of imaging studies) 2. Annual CEA.  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 In July 2005, he was treated with neoadjuvant radiation followed by surgery followed by adjuvant cisplatin with radiation, no evidence of recurrent  disease, no role of imaging studies patient is asymptomatic.   Prostate cancer Prostate cancer 2002: Treated with prostatectomy  Labs pending   Orders Placed This Encounter  Procedures  . CBC with Differential    Standing Status: Future     Number of Occurrences:      Standing Expiration Date: 11/06/2016  . Comprehensive metabolic panel    Standing Status: Future     Number of Occurrences:      Standing Expiration Date: 11/06/2016  . CEA    Standing Status: Future     Number of Occurrences:      Standing Expiration Date: 12/11/2016  . PSA    Standing Status: Future     Number of Occurrences:      Standing Expiration Date: 11/06/2016   The patient has a good understanding of the overall plan. he agrees with it. he will call with any problems that may develop before the next visit here.   Rulon Eisenmenger, MD 11/07/2015

## 2015-11-07 NOTE — Assessment & Plan Note (Signed)
Rectal adenocarcinoma treated with concurrent neoadjuvant chemoradiation with infusional 5-FU May 2005 through June 2005 followed by AP resection August 2005 at the colostomy, no evidence of recurrent disease, the patient reports that he had stage III disease.  Surveillance: 1. Since he is 12 years from initial diagnosis (no role of imaging studies) 2. Annual CEA.

## 2015-11-07 NOTE — Assessment & Plan Note (Signed)
Prostate cancer 2002: Treated with prostatectomy  Labs pending 

## 2015-11-07 NOTE — Assessment & Plan Note (Signed)
Squamous cell carcinoma the head and neck: Found as a neck lymph node without any primary when he went to get a PET CT scan for his rectal cancer. In July 2005, he was treated with neoadjuvant radiation followed by surgery followed by adjuvant cisplatin with radiation, no evidence of recurrent disease, no role of imaging studies patient is asymptomatic. 

## 2015-11-08 LAB — PSA

## 2015-11-08 LAB — CEA: CEA1: 1.7 ng/mL (ref 0.0–4.7)

## 2016-08-06 ENCOUNTER — Other Ambulatory Visit: Payer: Self-pay | Admitting: Emergency Medicine

## 2016-08-06 DIAGNOSIS — C20 Malignant neoplasm of rectum: Secondary | ICD-10-CM

## 2016-08-06 MED ORDER — UNABLE TO FIND: 1.0000 | 3 refills | Status: DC | PRN

## 2016-10-10 ENCOUNTER — Other Ambulatory Visit: Payer: Self-pay

## 2016-10-10 DIAGNOSIS — C20 Malignant neoplasm of rectum: Secondary | ICD-10-CM

## 2016-10-10 MED ORDER — OSTOMY DRAIN/MINI-POUCH/FLANGE MISC
1.0000 [IU] | Freq: Two times a day (BID) | 11 refills | Status: DC
Start: 2016-10-10 — End: 2016-11-08

## 2016-11-06 ENCOUNTER — Other Ambulatory Visit: Payer: Medicare Other

## 2016-11-06 ENCOUNTER — Ambulatory Visit: Payer: Medicare Other | Admitting: Hematology and Oncology

## 2016-11-06 NOTE — Assessment & Plan Note (Deleted)
Squamous cell carcinoma the head and neck: Found as a neck lymph node without any primary when he went to get a PET CT scan for his rectal cancer. In July 2005, he was treated with neoadjuvant radiation followed by surgery followed by adjuvant cisplatin with radiation,  No evidence of recurrent disease, no role of imaging studies patient is asymptomatic.

## 2016-11-06 NOTE — Assessment & Plan Note (Deleted)
Prostate cancer 2002: Treated with prostatectomy  Labs pending

## 2016-11-06 NOTE — Assessment & Plan Note (Deleted)
Rectal adenocarcinoma treated with concurrent neoadjuvant chemoradiation with infusional 5-FU May 2005 through June 2005 followed by AP resection August 2005 at the colostomy, no evidence of recurrent disease, the patient reports that he had stage III disease.  Surveillance: 1. Since he is 13 years from initial diagnosis (no role of imaging studies) 2. Annual CEA.

## 2016-11-08 ENCOUNTER — Other Ambulatory Visit: Payer: Self-pay | Admitting: Hematology and Oncology

## 2016-11-08 DIAGNOSIS — C20 Malignant neoplasm of rectum: Secondary | ICD-10-CM

## 2017-12-03 ENCOUNTER — Other Ambulatory Visit: Payer: Self-pay

## 2017-12-04 ENCOUNTER — Other Ambulatory Visit: Payer: Self-pay

## 2017-12-04 DIAGNOSIS — C61 Malignant neoplasm of prostate: Secondary | ICD-10-CM

## 2017-12-04 DIAGNOSIS — C760 Malignant neoplasm of head, face and neck: Secondary | ICD-10-CM

## 2017-12-04 DIAGNOSIS — Z933 Colostomy status: Secondary | ICD-10-CM

## 2017-12-04 DIAGNOSIS — Z85038 Personal history of other malignant neoplasm of large intestine: Secondary | ICD-10-CM

## 2017-12-04 DIAGNOSIS — C20 Malignant neoplasm of rectum: Secondary | ICD-10-CM

## 2017-12-04 MED ORDER — ADAPT PSTE: PASTE | 11 refills | Status: DC

## 2017-12-04 MED ORDER — OSTOMY DRAIN/MINI-POUCH/FLANGE MISC: 11 refills | Status: DC

## 2017-12-06 ENCOUNTER — Other Ambulatory Visit: Payer: Self-pay

## 2017-12-06 DIAGNOSIS — C20 Malignant neoplasm of rectum: Secondary | ICD-10-CM

## 2017-12-06 MED ORDER — UNABLE TO FIND
1.0000 | 3 refills | Status: AC | PRN
Start: 2017-12-06 — End: ?

## 2017-12-06 NOTE — Telephone Encounter (Signed)
Received fax from [facility] , they needed refill approved of ostomy supplies, ostomy Belt, skin barrier and pouch for patient. Attempted to e-scribe but decided to call [facility] pharmacy to ensure received approval and transcribed refills for these supplies per Dr Geralyn Flash notes, previously approved.

## 2017-12-31 ENCOUNTER — Telehealth: Payer: Self-pay

## 2017-12-31 NOTE — Telephone Encounter (Signed)
Received fax from [place] regarding "[employer] Paste - apply twice daily" requesting a refill.  Per previous notes in 2017, Dr Lindi Adie refilled ostomy supplies.  Per transcribed notes, refilled this ostomy supply this time but put note on fax that [place] can remind patient to make an appt for his annual check-up here.  (Per notes last appt was 11-07-2015 and there was a no-show appt in 2018.)

## 2018-02-06 ENCOUNTER — Other Ambulatory Visit: Payer: Self-pay | Admitting: *Deleted

## 2018-02-10 ENCOUNTER — Telehealth: Payer: Self-pay

## 2018-02-10 NOTE — Telephone Encounter (Signed)
Returned patient's call regarding need for refills for ostomy supplies.  Patient needs bags, paste, and wafers refilled.  Ok per Dr. Lindi Adie to refill.  Written orders signed and sent to [facility].  Patient's wife aware.

## 2018-02-11 ENCOUNTER — Telehealth: Payer: Self-pay | Admitting: Hematology and Oncology

## 2018-02-11 NOTE — Telephone Encounter (Signed)
Called pt re appts that were scheduled per 8/1 sch msg - couldn't leave voicemail, sending confirmation letters in the mail.

## 2018-02-17 ENCOUNTER — Other Ambulatory Visit: Payer: Self-pay | Admitting: *Deleted

## 2018-02-17 DIAGNOSIS — C61 Malignant neoplasm of prostate: Secondary | ICD-10-CM

## 2018-02-17 DIAGNOSIS — C20 Malignant neoplasm of rectum: Secondary | ICD-10-CM

## 2018-02-18 ENCOUNTER — Inpatient Hospital Stay: Payer: Medicare Other | Admitting: Hematology and Oncology

## 2018-02-18 ENCOUNTER — Inpatient Hospital Stay: Payer: Medicare Other

## 2018-02-18 NOTE — Assessment & Plan Note (Deleted)
Squamous cell carcinoma the head and neck: Found as a neck lymph node without any primary when he went to get a PET CT scan for his rectal cancer. In July 2005, he was treated with neoadjuvant radiation followed by surgery followed by adjuvant cisplatin with radiation, no evidence of recurrent disease, no role of imaging studies patient is asymptomatic.

## 2018-02-18 NOTE — Assessment & Plan Note (Deleted)
Prostate cancer 2002: Treated with prostatectomy  PSA obtained today

## 2018-02-18 NOTE — Assessment & Plan Note (Deleted)
Rectal adenocarcinoma treated with concurrent neoadjuvant chemoradiation with infusional 5-FU May 2005 through June 2005 followed by AP resection August 2005 at the colostomy, no evidence of recurrent disease, the patient reports that he had stage III disease.  Surveillance: 1. Since he is 13 years from initial diagnosis (no role of imaging studies) 2. Annual CEA.  Drawn today

## 2018-06-04 ENCOUNTER — Other Ambulatory Visit: Payer: Self-pay | Admitting: Hematology and Oncology

## 2018-06-04 DIAGNOSIS — C20 Malignant neoplasm of rectum: Secondary | ICD-10-CM

## 2018-06-11 ENCOUNTER — Other Ambulatory Visit: Payer: Self-pay | Admitting: Hematology and Oncology

## 2018-06-11 DIAGNOSIS — C760 Malignant neoplasm of head, face and neck: Secondary | ICD-10-CM

## 2018-06-11 DIAGNOSIS — Z85038 Personal history of other malignant neoplasm of large intestine: Secondary | ICD-10-CM

## 2018-06-11 DIAGNOSIS — Z933 Colostomy status: Secondary | ICD-10-CM

## 2018-06-11 DIAGNOSIS — C61 Malignant neoplasm of prostate: Secondary | ICD-10-CM

## 2018-06-11 DIAGNOSIS — C20 Malignant neoplasm of rectum: Secondary | ICD-10-CM

## 2018-09-12 ENCOUNTER — Other Ambulatory Visit: Payer: Self-pay

## 2018-09-12 DIAGNOSIS — Z933 Colostomy status: Secondary | ICD-10-CM

## 2018-09-12 DIAGNOSIS — Z85038 Personal history of other malignant neoplasm of large intestine: Secondary | ICD-10-CM

## 2018-09-12 DIAGNOSIS — C61 Malignant neoplasm of prostate: Secondary | ICD-10-CM

## 2018-09-12 DIAGNOSIS — C20 Malignant neoplasm of rectum: Secondary | ICD-10-CM

## 2018-09-12 DIAGNOSIS — C760 Malignant neoplasm of head, face and neck: Secondary | ICD-10-CM

## 2018-09-12 MED ORDER — ADAPT PSTE: PASTE | 6 refills | Status: DC

## 2018-09-22 ENCOUNTER — Other Ambulatory Visit: Payer: Self-pay | Admitting: *Deleted

## 2018-09-22 DIAGNOSIS — C760 Malignant neoplasm of head, face and neck: Secondary | ICD-10-CM

## 2018-09-22 DIAGNOSIS — C20 Malignant neoplasm of rectum: Secondary | ICD-10-CM

## 2018-09-22 DIAGNOSIS — C61 Malignant neoplasm of prostate: Secondary | ICD-10-CM

## 2018-09-22 DIAGNOSIS — Z85038 Personal history of other malignant neoplasm of large intestine: Secondary | ICD-10-CM

## 2018-09-22 DIAGNOSIS — Z933 Colostomy status: Secondary | ICD-10-CM

## 2018-09-22 MED ORDER — ADAPT PSTE: PASTE | 6 refills | Status: AC

## 2018-09-22 MED ORDER — OSTOMY DRAIN/MINI-POUCH/FLANGE MISC: 11 refills | Status: DC

## 2018-09-22 NOTE — Telephone Encounter (Signed)
Received call from pt wife Troy Brooks regarding refill for ostomy supplies.  Script given to Dr. Lindi Adie to sign and send to pharmacy.  Attempt to call pt and wife, received a message stating voice mail not set up, unable to leave message.

## 2018-09-23 ENCOUNTER — Other Ambulatory Visit: Payer: Self-pay

## 2018-09-23 DIAGNOSIS — C20 Malignant neoplasm of rectum: Secondary | ICD-10-CM

## 2018-09-23 MED ORDER — OSTOMY DRAIN/MINI-POUCH/FLANGE MISC: 11 refills | Status: AC

## 2019-07-22 ENCOUNTER — Telehealth: Payer: Self-pay | Admitting: Hematology and Oncology

## 2019-07-22 NOTE — Telephone Encounter (Signed)
Scheduled per 1/13 sch msg. Called pt no answer and unable to leave msg. Mailing printout

## 2019-08-03 ENCOUNTER — Encounter: Payer: Self-pay | Admitting: *Deleted

## 2019-08-25 ENCOUNTER — Inpatient Hospital Stay: Payer: Medicare Other | Attending: Hematology and Oncology | Admitting: Hematology and Oncology

## 2019-08-25 NOTE — Assessment & Plan Note (Deleted)
Prostate cancer 2002: Treated with prostatectomy  We will obtain a PSA today.

## 2019-08-25 NOTE — Assessment & Plan Note (Deleted)
Rectal adenocarcinoma treated with concurrent neoadjuvant chemoradiation with infusional 5-FU May 2005 through June 2005 followed by AP resection August 2005 at the colostomy, no evidence of recurrent disease, the patient reports that he had stage III disease.  Surveillance: 1. Since he is 13 years from initial diagnosis (no role of imaging studies) 2. Annual CEA: Send the patient to the lab today after to get a CEA done.

## 2019-08-25 NOTE — Assessment & Plan Note (Deleted)
Squamous cell carcinoma the head and neck: Found as a neck lymph node without any primary when he went to get a PET CT scan for his rectal cancer. In July 2005, he was treated with neoadjuvant radiation followed by surgery followed by adjuvant cisplatin with radiation, no evidence of recurrent disease, no role of imaging studies patient is asymptomatic.  No evidence of disease recurrence by clinical evaluation.

## 2019-10-09 ENCOUNTER — Telehealth: Payer: Self-pay | Admitting: Hematology and Oncology

## 2019-10-09 NOTE — Telephone Encounter (Signed)
Scheduled appt per 4/2 sch message  - unable to reach pt . Left message with appt date and time

## 2019-11-10 ENCOUNTER — Inpatient Hospital Stay: Payer: Medicare Other | Attending: Hematology and Oncology | Admitting: Hematology and Oncology

## 2019-11-10 NOTE — Assessment & Plan Note (Deleted)
Rectal adenocarcinoma treated with concurrent neoadjuvant chemoradiation with infusional 5-FU May 2005 through June 2005 followed by AP resection August 2005 at the colostomy, no evidence of recurrent disease, the patient reports that he had stage III disease.  Surveillance: 1.  No role of imaging studies since he is 13 years from diagnosis. 2. Annual CEA.  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 In July 2005, he was treated with neoadjuvant radiation followed by surgery followed by adjuvant cisplatin with radiation, no evidence of recurrent disease, no role of imaging studies patient is asymptomatic.  I discussed with the patient can be followed by primary care physician at this time.  CEA can be monitored along with his regular blood work. He can come back to see [place] on an as-needed basis.
